# Patient Record
Sex: Male | Born: 1950
Health system: Southern US, Community
[De-identification: ages and names within clinical notes are randomized; demographics above are authoritative.]

## PROBLEM LIST (undated history)

## (undated) DIAGNOSIS — I219 Acute myocardial infarction, unspecified: Secondary | ICD-10-CM

## (undated) DIAGNOSIS — R0609 Other forms of dyspnea: Secondary | ICD-10-CM

## (undated) DIAGNOSIS — E119 Type 2 diabetes mellitus without complications: Secondary | ICD-10-CM

## (undated) DIAGNOSIS — F329 Major depressive disorder, single episode, unspecified: Secondary | ICD-10-CM

## (undated) DIAGNOSIS — E7800 Pure hypercholesterolemia, unspecified: Secondary | ICD-10-CM

## (undated) DIAGNOSIS — I10 Essential (primary) hypertension: Secondary | ICD-10-CM

## (undated) DIAGNOSIS — M1711 Unilateral primary osteoarthritis, right knee: Secondary | ICD-10-CM

## (undated) DIAGNOSIS — Z9861 Coronary angioplasty status: Secondary | ICD-10-CM

## (undated) DIAGNOSIS — Z87442 Personal history of urinary calculi: Secondary | ICD-10-CM

## (undated) DIAGNOSIS — I2510 Atherosclerotic heart disease of native coronary artery without angina pectoris: Secondary | ICD-10-CM

## (undated) DIAGNOSIS — W3400XA Accidental discharge from unspecified firearms or gun, initial encounter: Secondary | ICD-10-CM

## (undated) DIAGNOSIS — F419 Anxiety disorder, unspecified: Secondary | ICD-10-CM

## (undated) DIAGNOSIS — M1712 Unilateral primary osteoarthritis, left knee: Secondary | ICD-10-CM

## (undated) DIAGNOSIS — F32A Depression, unspecified: Secondary | ICD-10-CM

## (undated) DIAGNOSIS — E669 Obesity, unspecified: Secondary | ICD-10-CM

## (undated) HISTORY — PX: LITHOTRIPSY: SUR834

## (undated) HISTORY — PX: FRACTURE SURGERY: SHX138

## (undated) HISTORY — DX: Obesity, unspecified: E66.9

## (undated) HISTORY — PX: CYSTOSCOPY W/ STONE MANIPULATION: SHX1427

## (undated) HISTORY — DX: Unilateral primary osteoarthritis, left knee: M17.12

## (undated) HISTORY — DX: Other forms of dyspnea: R06.09

## (undated) HISTORY — DX: Unilateral primary osteoarthritis, right knee: M17.11

## (undated) HISTORY — PX: CORONARY ANGIOPLASTY WITH STENT PLACEMENT: SHX49

## (undated) HISTORY — PX: JOINT REPLACEMENT: SHX530

## (undated) HISTORY — DX: Morbid (severe) obesity due to excess calories: E66.01

## (undated) HISTORY — DX: Type 2 diabetes mellitus without complications: E11.9

## (undated) HISTORY — DX: Coronary angioplasty status: Z98.61

---
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
## 1984-05-14 DIAGNOSIS — W3400XA Accidental discharge from unspecified firearms or gun, initial encounter: Secondary | ICD-10-CM

## 1984-05-14 HISTORY — DX: Accidental discharge from unspecified firearms or gun, initial encounter: W34.00XA

## 1988-05-14 DIAGNOSIS — I219 Acute myocardial infarction, unspecified: Secondary | ICD-10-CM

## 1988-05-14 HISTORY — DX: Acute myocardial infarction, unspecified: I21.9

## 2000-05-27 ENCOUNTER — Encounter: Admission: RE | Admit: 2000-05-27 | Discharge: 2000-08-25 | Payer: Self-pay | Admitting: Endocrinology

## 2006-11-06 ENCOUNTER — Encounter (INDEPENDENT_AMBULATORY_CARE_PROVIDER_SITE_OTHER): Payer: Self-pay | Admitting: *Deleted

## 2006-11-06 ENCOUNTER — Ambulatory Visit (HOSPITAL_COMMUNITY): Admission: RE | Admit: 2006-11-06 | Discharge: 2006-11-06 | Payer: Self-pay | Admitting: *Deleted

## 2010-09-26 NOTE — Op Note (Signed)
Corey Romero, Corey Romero NO.:  [id number]   MEDICAL RECORD NO.:  [id number]          PATIENT TYPE:  AMB   LOCATION:  ENDO                         FACILITY:  [facility]   PHYSICIAN:  Georgiana Spinner, M.D.    DATE OF BIRTH:  01-17-1951   DATE OF PROCEDURE:  11/06/2006  DATE OF DISCHARGE:                               OPERATIVE REPORT   PROCEDURE:  Colonoscopy.   INDICATIONS:  Colon polyp, colon cancer screening.   ANESTHESIA:  Demerol 120 mg, Versed 10 mg, Benadryl 12.5 mg.   PROCEDURE:  With the patient mildly sedated in the left lateral  decubitus position, a rectal examination was attempted.  Subsequently  the Pentax videoscopic colonoscope was inserted into the rectum and  passed under direct vision to the cecum; identified by ileocecal valve  and appendiceal orifice, both of which were photographed.  From this  point the colonoscope was slowly withdrawn taking circumferential views  of colonic mucosa stopping at various places to wash and suction  retained fecal material so that the prep was slightly suboptimal in that  regards.   We did this until we withdrew all the way to the rectum which showed a  small polyp that was photographed and removed using snare cautery  technique setting of 20/150 blended current.  The tissue was suctioned  into the endoscope, and retrieved through a tissue trap for pathology.  The endoscope was then placed in retroflexion to view the anal canal  from above.  The endoscope was straightened and withdrawn.  The  patient's vital signs, and pulse oximeter remained stable.  The patient  tolerated procedure well without apparent complications.   FINDINGS:  1. Internal hemorrhoids that were bleeding somewhat possibly due to      the trauma of prep and/or the procedure.  2. Diverticulosis, moderately severe of the of sigmoid colon.  3. Small polyp of rectum, removed.  Await biopsy report.  4. The patient will call me for results and  follow-up with me as      needed as an outpatient.           ______________________________  Georgiana Spinner, M.D.     GMO/MEDQ  D:  11/06/2006  T:  11/06/2006  Job:  [id number]

## 2012-06-24 ENCOUNTER — Other Ambulatory Visit: Payer: Self-pay | Admitting: Orthopedic Surgery

## 2012-06-24 ENCOUNTER — Encounter (HOSPITAL_COMMUNITY): Payer: Self-pay

## 2012-06-25 ENCOUNTER — Encounter (HOSPITAL_COMMUNITY): Payer: Self-pay

## 2012-06-30 ENCOUNTER — Encounter (HOSPITAL_COMMUNITY)
Admission: RE | Admit: 2012-06-30 | Discharge: 2012-06-30 | Disposition: A | Discharge status: Home / Self Care | Payer: BC Managed Care – PPO | Source: Ambulatory Visit | Attending: Orthopedic Surgery | Admitting: Orthopedic Surgery

## 2012-06-30 ENCOUNTER — Encounter (HOSPITAL_COMMUNITY): Payer: Self-pay

## 2012-06-30 DIAGNOSIS — Z01812 Encounter for preprocedural laboratory examination: Secondary | ICD-10-CM | POA: Insufficient documentation

## 2012-06-30 DIAGNOSIS — Z01811 Encounter for preprocedural respiratory examination: Secondary | ICD-10-CM | POA: Insufficient documentation

## 2012-06-30 DIAGNOSIS — Z01810 Encounter for preprocedural cardiovascular examination: Secondary | ICD-10-CM | POA: Insufficient documentation

## 2012-06-30 DIAGNOSIS — Z01818 Encounter for other preprocedural examination: Secondary | ICD-10-CM | POA: Insufficient documentation

## 2012-06-30 HISTORY — DX: Essential (primary) hypertension: I10

## 2012-06-30 HISTORY — DX: Anxiety disorder, unspecified: F41.9

## 2012-06-30 HISTORY — DX: Depression, unspecified: F32.A

## 2012-06-30 HISTORY — DX: Atherosclerotic heart disease of native coronary artery without angina pectoris: I25.10

## 2012-06-30 HISTORY — DX: Accidental discharge from unspecified firearms or gun, initial encounter: W34.00XA

## 2012-06-30 HISTORY — DX: Major depressive disorder, single episode, unspecified: F32.9

## 2012-06-30 LAB — CBC WITH DIFFERENTIAL/PLATELET
Basophils Relative: 0 % (ref 0–1)
Eosinophils Absolute: 0.3 10*3/uL (ref 0.0–0.7)
Eosinophils Relative: 4 % (ref 0–5)
HCT: 43.4 % (ref 39.0–52.0)
Hemoglobin: 14.8 g/dL (ref 13.0–17.0)
Lymphs Abs: 3.1 10*3/uL (ref 0.7–4.0)
MCH: 32.2 pg (ref 26.0–34.0)
MCHC: 34.1 g/dL (ref 30.0–36.0)
MCV: 94.6 fL (ref 78.0–100.0)
Monocytes Absolute: 1.1 10*3/uL — ABNORMAL HIGH (ref 0.1–1.0)
Monocytes Relative: 11 % (ref 3–12)
RBC: 4.59 MIL/uL (ref 4.22–5.81)

## 2012-06-30 LAB — BASIC METABOLIC PANEL
BUN: 22 mg/dL (ref 6–23)
CO2: 28 mEq/L (ref 19–32)
GFR calc non Af Amer: 68 mL/min — ABNORMAL LOW (ref >90)
Glucose, Bld: 178 mg/dL — ABNORMAL HIGH (ref 70–99)
Potassium: 5.5 mEq/L — ABNORMAL HIGH (ref 3.5–5.1)
Sodium: 143 mEq/L (ref 135–145)

## 2012-06-30 LAB — URINALYSIS, ROUTINE W REFLEX MICROSCOPIC
Glucose, UA: >1000 mg/dL — AB
Ketones, ur: 15 mg/dL — AB
Leukocytes, UA: NEGATIVE
Nitrite: NEGATIVE
pH: 5 (ref 5.0–8.0)

## 2012-06-30 LAB — TYPE AND SCREEN

## 2012-06-30 LAB — URINE MICROSCOPIC-ADD ON

## 2012-06-30 LAB — SURGICAL PCR SCREEN: MRSA, PCR: NEGATIVE

## 2012-06-30 NOTE — Progress Notes (Signed)
Patient questionnaire for sleep apnea faxed to Dr Juleen China.

## 2012-06-30 NOTE — Pre-Procedure Instructions (Signed)
AIMAN NOE  06/30/2012   Your procedure is scheduled on:  Wednesday, Feb. 26  Report to [facility] at 1030 AM.  Call this number if you have problems the morning of surgery: [phone]   Remember:   Do not eat food or drink liquids after midnight.Tuesday night   Take these medicines the morning of surgery with A SIP OF WATER: Xanax,Metoprolol,Oxycodone,   Do not wear jewelry, make-up or nail polish.  Do not wear lotions, powders, or perfumes. You may wear deodorant.  Do not shave 48 hours prior to surgery. Men may shave face and neck.  Do not bring valuables to the hospital.  Contacts, dentures or bridgework may not be worn into surgery.  Leave suitcase in the car. After surgery it may be brought to your room.  For patients admitted to the hospital, checkout time is 11:00 AM the day of  discharge.   Special Instructions: Incentive Spirometry - Practice and bring it with you on the day of surgery. Shower using CHG 2 nights before surgery and the night before surgery.  If you shower the day of surgery use CHG.  Use special wash - you have one bottle of CHG for all showers.  You should use approximately 1/3 of the bottle for each shower.   Please read over the following fact sheets that you were given: Pain Booklet, Coughing and Deep Breathing, Blood Transfusion Information, Total Joint Packet and Surgical Site Infection Prevention

## 2012-06-30 NOTE — Progress Notes (Signed)
Stop Corey Romero tool for  Sleep apnea faxed to Dr Juleen China EKG requested from Dr Juleen China to compare with abnl EKG done today pre op
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
## 2012-07-04 NOTE — Progress Notes (Signed)
Spoke with Agustin Cree at Dr. Wadie Lessen office today.  She states patient saw Dr. Harm[facility] yesterday, 07/03/12.  She is waiting for note from Dr. Anne Fu.  She states note should show up in EPIC.  Not in chart yet.

## 2012-07-08 MED ORDER — CEFAZOLIN SODIUM-DEXTROSE 2-3 GM-% IV SOLR
2.0000 g | INTRAVENOUS | Status: AC
  Administered 2012-07-09: 2 g via INTRAVENOUS
  Filled 2012-07-08: qty 50

## 2012-07-08 NOTE — Progress Notes (Signed)
REQUESTED CARDIAC TESTS,  AND CARDIAC CLEARANCE NOTE FROM [facility]. (PATIENT WAS SEEN TODAY 07/08/12).
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
## 2012-07-09 ENCOUNTER — Encounter (HOSPITAL_COMMUNITY): Payer: Self-pay | Admitting: *Deleted

## 2012-07-09 ENCOUNTER — Inpatient Hospital Stay (HOSPITAL_COMMUNITY)
Admission: RE | Admit: 2012-07-09 | Discharge: 2012-07-12 | DRG: 209 | Disposition: A | Discharge status: Home Health | Payer: BC Managed Care – PPO | Source: Ambulatory Visit | Attending: Orthopedic Surgery | Admitting: Orthopedic Surgery

## 2012-07-09 ENCOUNTER — Inpatient Hospital Stay (HOSPITAL_COMMUNITY): Payer: BC Managed Care – PPO | Admitting: Anesthesiology

## 2012-07-09 ENCOUNTER — Encounter (HOSPITAL_COMMUNITY): Payer: Self-pay | Admitting: Vascular Surgery

## 2012-07-09 ENCOUNTER — Encounter (HOSPITAL_COMMUNITY)
Admission: RE | Disposition: A | Discharge status: Home Health | Payer: Self-pay | Source: Ambulatory Visit | Attending: Orthopedic Surgery

## 2012-07-09 DIAGNOSIS — Z7982 Long term (current) use of aspirin: Secondary | ICD-10-CM

## 2012-07-09 DIAGNOSIS — M1712 Unilateral primary osteoarthritis, left knee: Secondary | ICD-10-CM

## 2012-07-09 DIAGNOSIS — F411 Generalized anxiety disorder: Secondary | ICD-10-CM | POA: Diagnosis present

## 2012-07-09 DIAGNOSIS — Z794 Long term (current) use of insulin: Secondary | ICD-10-CM

## 2012-07-09 DIAGNOSIS — Z01818 Encounter for other preprocedural examination: Secondary | ICD-10-CM

## 2012-07-09 DIAGNOSIS — I2510 Atherosclerotic heart disease of native coronary artery without angina pectoris: Secondary | ICD-10-CM | POA: Diagnosis present

## 2012-07-09 DIAGNOSIS — Z9861 Coronary angioplasty status: Secondary | ICD-10-CM

## 2012-07-09 DIAGNOSIS — Z79899 Other long term (current) drug therapy: Secondary | ICD-10-CM

## 2012-07-09 DIAGNOSIS — Z01810 Encounter for preprocedural cardiovascular examination: Secondary | ICD-10-CM

## 2012-07-09 DIAGNOSIS — Z01811 Encounter for preprocedural respiratory examination: Secondary | ICD-10-CM

## 2012-07-09 DIAGNOSIS — Z01812 Encounter for preprocedural laboratory examination: Secondary | ICD-10-CM

## 2012-07-09 DIAGNOSIS — M1710 Unilateral primary osteoarthritis, unspecified knee: Principal | ICD-10-CM | POA: Diagnosis present

## 2012-07-09 DIAGNOSIS — F3289 Other specified depressive episodes: Secondary | ICD-10-CM | POA: Diagnosis present

## 2012-07-09 DIAGNOSIS — E119 Type 2 diabetes mellitus without complications: Secondary | ICD-10-CM | POA: Diagnosis present

## 2012-07-09 DIAGNOSIS — I10 Essential (primary) hypertension: Secondary | ICD-10-CM | POA: Diagnosis present

## 2012-07-09 DIAGNOSIS — Z87891 Personal history of nicotine dependence: Secondary | ICD-10-CM

## 2012-07-09 DIAGNOSIS — F329 Major depressive disorder, single episode, unspecified: Secondary | ICD-10-CM | POA: Diagnosis present

## 2012-07-09 HISTORY — PX: TOTAL KNEE ARTHROPLASTY: SHX125

## 2012-07-09 HISTORY — DX: Unilateral primary osteoarthritis, left knee: M17.12

## 2012-07-09 LAB — GLUCOSE, CAPILLARY
Glucose-Capillary: 293 mg/dL — ABNORMAL HIGH (ref 70–99)
Glucose-Capillary: 229 mg/dL — ABNORMAL HIGH (ref 70–99)
Glucose-Capillary: 212 mg/dL — ABNORMAL HIGH (ref 70–99)
Glucose-Capillary: 235 mg/dL — ABNORMAL HIGH (ref 70–99)

## 2012-07-09 SURGERY — ARTHROPLASTY, KNEE, TOTAL
Anesthesia: General | Site: Knee | Laterality: Left | Wound class: Clean

## 2012-07-09 MED ORDER — OXYCODONE HCL 5 MG PO TABS
10.0000 mg | ORAL_TABLET | ORAL | Status: DC | PRN
  Administered 2012-07-09: 10 mg via ORAL
  Administered 2012-07-10 (×3): 15 mg via ORAL
  Administered 2012-07-10 (×2): 10 mg via ORAL
  Administered 2012-07-11 (×4): 15 mg via ORAL
  Administered 2012-07-11: 10 mg via ORAL
  Administered 2012-07-12 (×2): 15 mg via ORAL
  Filled 2012-07-09: qty 3
  Filled 2012-07-09: qty 2
  Filled 2012-07-09 (×5): qty 3
  Filled 2012-07-09 (×2): qty 2
  Filled 2012-07-09: qty 3
  Filled 2012-07-09: qty 2
  Filled 2012-07-09 (×3): qty 3

## 2012-07-09 MED ORDER — ONDANSETRON HCL 4 MG/2ML IJ SOLN: 4.0000 mg | Freq: Four times a day (QID) | INTRAMUSCULAR | Status: DC | PRN

## 2012-07-09 MED ORDER — ONDANSETRON HCL 4 MG/2ML IJ SOLN
INTRAMUSCULAR | Status: DC | PRN
  Administered 2012-07-09: 4 mg via INTRAVENOUS

## 2012-07-09 MED ORDER — PHENOL 1.4 % MT LIQD: 1.0000 | OROMUCOSAL | Status: DC | PRN

## 2012-07-09 MED ORDER — ALUM & MAG HYDROXIDE-SIMETH 200-200-20 MG/5ML PO SUSP: 30.0000 mL | ORAL | Status: DC | PRN

## 2012-07-09 MED ORDER — PIOGLITAZONE HCL 45 MG PO TABS
45.0000 mg | ORAL_TABLET | Freq: Every day | ORAL | Status: DC
  Administered 2012-07-09 – 2012-07-12 (×4): 45 mg via ORAL
  Filled 2012-07-09 (×4): qty 1

## 2012-07-09 MED ORDER — SODIUM CHLORIDE 0.9 % IR SOLN
Status: DC | PRN
  Administered 2012-07-09: 3000 mL

## 2012-07-09 MED ORDER — FENTANYL CITRATE 0.05 MG/ML IJ SOLN
INTRAMUSCULAR | Status: DC | PRN
  Administered 2012-07-09: 50 ug via INTRAVENOUS
  Administered 2012-07-09 (×2): 100 ug via INTRAVENOUS

## 2012-07-09 MED ORDER — SERTRALINE HCL 100 MG PO TABS
100.0000 mg | ORAL_TABLET | Freq: Every day | ORAL | Status: DC
  Administered 2012-07-09 – 2012-07-12 (×4): 100 mg via ORAL
  Filled 2012-07-09 (×4): qty 1

## 2012-07-09 MED ORDER — METHOCARBAMOL 500 MG PO TABS
500.0000 mg | ORAL_TABLET | Freq: Four times a day (QID) | ORAL | Status: DC | PRN
  Administered 2012-07-09 – 2012-07-12 (×8): 500 mg via ORAL
  Filled 2012-07-09 (×8): qty 1

## 2012-07-09 MED ORDER — METOPROLOL TARTRATE 25 MG PO TABS: 25.0000 mg | ORAL_TABLET | Freq: Once | ORAL | Status: AC

## 2012-07-09 MED ORDER — PROMETHAZINE HCL 25 MG/ML IJ SOLN: 6.2500 mg | INTRAMUSCULAR | Status: DC | PRN

## 2012-07-09 MED ORDER — MENTHOL 3 MG MT LOZG: 1.0000 | LOZENGE | OROMUCOSAL | Status: DC | PRN

## 2012-07-09 MED ORDER — DEXTROSE-NACL 5-0.45 % IV SOLN: INTRAVENOUS | Status: DC

## 2012-07-09 MED ORDER — ALPRAZOLAM 0.5 MG PO TABS: 2.0000 mg | ORAL_TABLET | Freq: Three times a day (TID) | ORAL | Status: DC | PRN

## 2012-07-09 MED ORDER — CEFUROXIME SODIUM 1.5 G IJ SOLR
INTRAMUSCULAR | Status: AC
  Filled 2012-07-09: qty 1.5

## 2012-07-09 MED ORDER — HYDROMORPHONE HCL PF 1 MG/ML IJ SOLN
0.2500 mg | INTRAMUSCULAR | Status: DC | PRN
  Administered 2012-07-09 (×2): 0.5 mg via INTRAVENOUS

## 2012-07-09 MED ORDER — FLEET ENEMA 7-19 GM/118ML RE ENEM: 1.0000 | ENEMA | Freq: Once | RECTAL | Status: AC | PRN

## 2012-07-09 MED ORDER — LISINOPRIL 20 MG PO TABS
20.0000 mg | ORAL_TABLET | Freq: Every day | ORAL | Status: DC
  Administered 2012-07-09 – 2012-07-12 (×4): 20 mg via ORAL
  Filled 2012-07-09 (×4): qty 1

## 2012-07-09 MED ORDER — ACETAMINOPHEN 650 MG RE SUPP: 650.0000 mg | Freq: Four times a day (QID) | RECTAL | Status: DC | PRN

## 2012-07-09 MED ORDER — KCL IN DEXTROSE-NACL 20-5-0.45 MEQ/L-%-% IV SOLN
INTRAVENOUS | Status: DC
  Administered 2012-07-09 – 2012-07-10 (×2): via INTRAVENOUS
  Filled 2012-07-09 (×10): qty 1000

## 2012-07-09 MED ORDER — HYDROMORPHONE HCL PF 1 MG/ML IJ SOLN
INTRAMUSCULAR | Status: AC
  Administered 2012-07-09: 0.5 mg via INTRAVENOUS
  Filled 2012-07-09: qty 1

## 2012-07-09 MED ORDER — METOCLOPRAMIDE HCL 5 MG/ML IJ SOLN: 5.0000 mg | Freq: Three times a day (TID) | INTRAMUSCULAR | Status: DC | PRN

## 2012-07-09 MED ORDER — LIDOCAINE HCL (CARDIAC) 20 MG/ML IV SOLN
INTRAVENOUS | Status: DC | PRN
  Administered 2012-07-09: 100 mg via INTRAVENOUS

## 2012-07-09 MED ORDER — OXYCODONE HCL 5 MG/5ML PO SOLN: 5.0000 mg | Freq: Once | ORAL | Status: DC | PRN

## 2012-07-09 MED ORDER — METHOCARBAMOL 500 MG PO TABS
ORAL_TABLET | ORAL | Status: AC
  Administered 2012-07-09: 500 mg via ORAL
  Filled 2012-07-09: qty 1

## 2012-07-09 MED ORDER — HYDROMORPHONE HCL PF 1 MG/ML IJ SOLN: 1.0000 mg | INTRAMUSCULAR | Status: DC | PRN

## 2012-07-09 MED ORDER — ZOLPIDEM TARTRATE 5 MG PO TABS: 5.0000 mg | ORAL_TABLET | Freq: Every evening | ORAL | Status: DC | PRN

## 2012-07-09 MED ORDER — DIPHENHYDRAMINE HCL 12.5 MG/5ML PO ELIX: 12.5000 mg | ORAL_SOLUTION | ORAL | Status: DC | PRN

## 2012-07-09 MED ORDER — OXYCODONE HCL 5 MG PO TABS: 5.0000 mg | ORAL_TABLET | Freq: Once | ORAL | Status: DC | PRN

## 2012-07-09 MED ORDER — 0.9 % SODIUM CHLORIDE (POUR BTL) OPTIME
TOPICAL | Status: DC | PRN
  Administered 2012-07-09: 1000 mL

## 2012-07-09 MED ORDER — GLIPIZIDE 10 MG PO TABS
10.0000 mg | ORAL_TABLET | Freq: Every day | ORAL | Status: DC
  Administered 2012-07-10 – 2012-07-12 (×3): 10 mg via ORAL
  Filled 2012-07-09 (×4): qty 1

## 2012-07-09 MED ORDER — INSULIN DETEMIR 100 UNIT/ML ~~LOC~~ SOLN
40.0000 [IU] | Freq: Every evening | SUBCUTANEOUS | Status: DC
  Administered 2012-07-09 – 2012-07-11 (×3): 40 [IU] via SUBCUTANEOUS
  Filled 2012-07-09: qty 10

## 2012-07-09 MED ORDER — CEFUROXIME SODIUM 1.5 G IJ SOLR
INTRAMUSCULAR | Status: DC | PRN
  Administered 2012-07-09: 1.5 g

## 2012-07-09 MED ORDER — MAGNESIUM HYDROXIDE 400 MG/5ML PO SUSP: 30.0000 mL | Freq: Every day | ORAL | Status: DC | PRN

## 2012-07-09 MED ORDER — DEXTROSE 5 % IV SOLN: 500.0000 mg | Freq: Four times a day (QID) | INTRAVENOUS | Status: DC | PRN

## 2012-07-09 MED ORDER — CHLORHEXIDINE GLUCONATE 4 % EX LIQD: 60.0000 mL | Freq: Once | CUTANEOUS | Status: DC

## 2012-07-09 MED ORDER — FENTANYL CITRATE 0.05 MG/ML IJ SOLN
INTRAMUSCULAR | Status: AC
  Filled 2012-07-09: qty 2

## 2012-07-09 MED ORDER — METOPROLOL TARTRATE 50 MG PO TABS
ORAL_TABLET | ORAL | Status: AC
  Filled 2012-07-09: qty 1

## 2012-07-09 MED ORDER — MIDAZOLAM HCL 2 MG/2ML IJ SOLN: 1.0000 mg | INTRAMUSCULAR | Status: DC | PRN

## 2012-07-09 MED ORDER — INSULIN ASPART 100 UNIT/ML ~~LOC~~ SOLN
0.0000 [IU] | Freq: Three times a day (TID) | SUBCUTANEOUS | Status: DC
  Administered 2012-07-09 – 2012-07-11 (×6): 5 [IU] via SUBCUTANEOUS
  Administered 2012-07-11: 2 [IU] via SUBCUTANEOUS
  Administered 2012-07-12: 3 [IU] via SUBCUTANEOUS

## 2012-07-09 MED ORDER — ATORVASTATIN CALCIUM 20 MG PO TABS
20.0000 mg | ORAL_TABLET | Freq: Every evening | ORAL | Status: DC
  Administered 2012-07-09 – 2012-07-11 (×3): 20 mg via ORAL
  Filled 2012-07-09 (×4): qty 1

## 2012-07-09 MED ORDER — BISACODYL 5 MG PO TBEC: 5.0000 mg | DELAYED_RELEASE_TABLET | Freq: Every day | ORAL | Status: DC | PRN

## 2012-07-09 MED ORDER — PROPOFOL 10 MG/ML IV BOLUS
INTRAVENOUS | Status: DC | PRN
  Administered 2012-07-09: 200 mg via INTRAVENOUS
  Administered 2012-07-09: 100 mg via INTRAVENOUS

## 2012-07-09 MED ORDER — MIDAZOLAM HCL 5 MG/5ML IJ SOLN
INTRAMUSCULAR | Status: DC | PRN
  Administered 2012-07-09: 2 mg via INTRAVENOUS

## 2012-07-09 MED ORDER — METFORMIN HCL 500 MG PO TABS
1000.0000 mg | ORAL_TABLET | Freq: Two times a day (BID) | ORAL | Status: DC
  Administered 2012-07-09 – 2012-07-12 (×6): 1000 mg via ORAL
  Filled 2012-07-09 (×8): qty 2

## 2012-07-09 MED ORDER — ACETAMINOPHEN 325 MG PO TABS: 650.0000 mg | ORAL_TABLET | Freq: Four times a day (QID) | ORAL | Status: DC | PRN

## 2012-07-09 MED ORDER — EXENATIDE ER 2 MG ~~LOC~~ SUSR: 2.0000 mg | SUBCUTANEOUS | Status: DC

## 2012-07-09 MED ORDER — METOPROLOL TARTRATE 12.5 MG HALF TABLET
ORAL_TABLET | ORAL | Status: AC
  Administered 2012-07-09: 25 mg via ORAL
  Filled 2012-07-09: qty 2

## 2012-07-09 MED ORDER — ASPIRIN EC 325 MG PO TBEC
325.0000 mg | DELAYED_RELEASE_TABLET | Freq: Two times a day (BID) | ORAL | Status: DC
  Administered 2012-07-09 – 2012-07-12 (×6): 325 mg via ORAL
  Filled 2012-07-09 (×7): qty 1

## 2012-07-09 MED ORDER — ONDANSETRON HCL 4 MG PO TABS: 4.0000 mg | ORAL_TABLET | Freq: Four times a day (QID) | ORAL | Status: DC | PRN

## 2012-07-09 MED ORDER — FENTANYL CITRATE 0.05 MG/ML IJ SOLN: 50.0000 ug | Freq: Once | INTRAMUSCULAR | Status: DC

## 2012-07-09 MED ORDER — CELECOXIB 200 MG PO CAPS
200.0000 mg | ORAL_CAPSULE | Freq: Two times a day (BID) | ORAL | Status: DC | PRN
  Filled 2012-07-09: qty 1

## 2012-07-09 MED ORDER — METOPROLOL TARTRATE 25 MG PO TABS
25.0000 mg | ORAL_TABLET | Freq: Every day | ORAL | Status: DC
  Administered 2012-07-10 – 2012-07-12 (×3): 25 mg via ORAL
  Filled 2012-07-09 (×4): qty 1

## 2012-07-09 MED ORDER — METOCLOPRAMIDE HCL 10 MG PO TABS: 5.0000 mg | ORAL_TABLET | Freq: Three times a day (TID) | ORAL | Status: DC | PRN

## 2012-07-09 MED ORDER — MIDAZOLAM HCL 2 MG/2ML IJ SOLN
INTRAMUSCULAR | Status: AC
  Filled 2012-07-09: qty 2

## 2012-07-09 MED ORDER — SUCCINYLCHOLINE CHLORIDE 20 MG/ML IJ SOLN
INTRAMUSCULAR | Status: DC | PRN
  Administered 2012-07-09: 100 mg via INTRAVENOUS

## 2012-07-09 MED ORDER — LACTATED RINGERS IV SOLN
INTRAVENOUS | Status: DC
  Administered 2012-07-09 (×2): via INTRAVENOUS

## 2012-07-09 SURGICAL SUPPLY — 57 items
BANDAGE ESMARK 6X9 LF (GAUZE/BANDAGES/DRESSINGS) ×1 IMPLANT
BLADE SAG 18X100X1.27 (BLADE) ×2 IMPLANT
BLADE SAW SGTL 13X75X1.27 (BLADE) ×2 IMPLANT
BLADE SURG ROTATE 9660 (MISCELLANEOUS) IMPLANT
BNDG CMPR 9X6 STRL LF SNTH (GAUZE/BANDAGES/DRESSINGS) ×1
BNDG CMPR MED 10X6 ELC LF (GAUZE/BANDAGES/DRESSINGS) ×1
BNDG ELASTIC 6X10 VLCR STRL LF (GAUZE/BANDAGES/DRESSINGS) ×2 IMPLANT
BNDG ESMARK 6X9 LF (GAUZE/BANDAGES/DRESSINGS) ×2
BOWL SMART MIX CTS (DISPOSABLE) ×2 IMPLANT
CEMENT HV SMART SET (Cement) ×4 IMPLANT
CLOTH BEACON ORANGE TIMEOUT ST (SAFETY) ×2 IMPLANT
COVER SURGICAL LIGHT HANDLE (MISCELLANEOUS) ×2 IMPLANT
CUFF TOURNIQUET SINGLE 34IN LL (TOURNIQUET CUFF) ×2 IMPLANT
CUFF TOURNIQUET SINGLE 44IN (TOURNIQUET CUFF) IMPLANT
DRAPE EXTREMITY T 121X128X90 (DRAPE) ×2 IMPLANT
DRAPE U-SHAPE 47X51 STRL (DRAPES) ×2 IMPLANT
DRSG PAD ABDOMINAL 8X10 ST (GAUZE/BANDAGES/DRESSINGS) ×4 IMPLANT
DURAPREP 26ML APPLICATOR (WOUND CARE) ×2 IMPLANT
ELECT REM PT RETURN 9FT ADLT (ELECTROSURGICAL) ×2
ELECTRODE REM PT RTRN 9FT ADLT (ELECTROSURGICAL) ×1 IMPLANT
EVACUATOR 1/8 PVC DRAIN (DRAIN) ×2 IMPLANT
GAUZE XEROFORM 1X8 LF (GAUZE/BANDAGES/DRESSINGS) ×2 IMPLANT
GLOVE BIO SURGEON STRL SZ7 (GLOVE) ×2 IMPLANT
GLOVE BIO SURGEON STRL SZ7.5 (GLOVE) ×6 IMPLANT
GLOVE BIOGEL PI IND STRL 7.0 (GLOVE) ×1 IMPLANT
GLOVE BIOGEL PI IND STRL 7.5 (GLOVE) IMPLANT
GLOVE BIOGEL PI IND STRL 8 (GLOVE) ×1 IMPLANT
GLOVE BIOGEL PI INDICATOR 7.0 (GLOVE) ×2
GLOVE BIOGEL PI INDICATOR 7.5 (GLOVE) ×1
GLOVE BIOGEL PI INDICATOR 8 (GLOVE) ×1
GLOVE SURG SS PI 8.5 STRL IVOR (GLOVE) ×2
GLOVE SURG SS PI 8.5 STRL STRW (GLOVE) ×2 IMPLANT
GOWN PREVENTION PLUS XLARGE (GOWN DISPOSABLE) ×3 IMPLANT
GOWN STRL NON-REIN LRG LVL3 (GOWN DISPOSABLE) ×4 IMPLANT
HANDPIECE INTERPULSE COAX TIP (DISPOSABLE) ×2
HOOD PEEL AWAY FACE SHEILD DIS (HOOD) ×4 IMPLANT
KIT BASIN OR (CUSTOM PROCEDURE TRAY) ×2 IMPLANT
KIT ROOM TURNOVER OR (KITS) ×2 IMPLANT
MANIFOLD NEPTUNE II (INSTRUMENTS) ×2 IMPLANT
NS IRRIG 1000ML POUR BTL (IV SOLUTION) ×2 IMPLANT
PACK TOTAL JOINT (CUSTOM PROCEDURE TRAY) ×2 IMPLANT
PAD ARMBOARD 7.5X6 YLW CONV (MISCELLANEOUS) ×4 IMPLANT
PADDING CAST COTTON 6X4 STRL (CAST SUPPLIES) ×2 IMPLANT
SET HNDPC FAN SPRY TIP SCT (DISPOSABLE) ×1 IMPLANT
SPONGE GAUZE 4X4 12PLY (GAUZE/BANDAGES/DRESSINGS) ×4 IMPLANT
STAPLER VISISTAT 35W (STAPLE) ×2 IMPLANT
SUCTION FRAZIER TIP 10 FR DISP (SUCTIONS) ×2 IMPLANT
SUT VIC AB 0 CTX 36 (SUTURE) ×2
SUT VIC AB 0 CTX36XBRD ANTBCTR (SUTURE) ×1 IMPLANT
SUT VIC AB 1 CTX 36 (SUTURE) ×2
SUT VIC AB 1 CTX36XBRD ANBCTR (SUTURE) ×1 IMPLANT
SUT VIC AB 2-0 CT1 27 (SUTURE) ×2
SUT VIC AB 2-0 CT1 TAPERPNT 27 (SUTURE) ×1 IMPLANT
TOWEL OR 17X24 6PK STRL BLUE (TOWEL DISPOSABLE) ×2 IMPLANT
TOWEL OR 17X26 10 PK STRL BLUE (TOWEL DISPOSABLE) ×2 IMPLANT
TRAY FOLEY CATH 14FR (SET/KITS/TRAYS/PACK) ×2 IMPLANT
WATER STERILE IRR 1000ML POUR (IV SOLUTION) ×8 IMPLANT

## 2012-07-09 NOTE — Preoperative (Signed)
Beta Blockers   Reason not to administer Beta Blockers:took lopressor today 

## 2012-07-09 NOTE — Interval H&P Note (Signed)
History and Physical Interval Note:  07/09/2012 12:10 PM  Corey Romero  has presented today for surgery, with the diagnosis of LEFT KNEE OSTEOARTHRITIS   The various methods of treatment have been discussed with the patient and family. After consideration of risks, benefits and other options for treatment, the patient has consented to  Procedure(s): TOTAL KNEE ARTHROPLASTY (Left) as a surgical intervention .  The patient's history has been reviewed, patient examined, no change in status, stable for surgery.  I have reviewed the patient's chart and labs.  Questions were answered to the patient's satisfaction.     Nestor Lewandowsky

## 2012-07-09 NOTE — Anesthesia Procedure Notes (Addendum)
Procedure Name: Intubation Date/Time: 07/09/2012 1:50 PM Performed by: Charm Barges, DAVID R Pre-anesthesia Checklist: Patient identified, Emergency Drugs available, Suction available, Patient being monitored and Timeout performed Patient Re-evaluated:Patient Re-evaluated prior to inductionOxygen Delivery Method: Circle system utilized Preoxygenation: Pre-oxygenation with 100% oxygen Intubation Type: Rapid sequence and IV induction Laryngoscope Size: Miller and 3 Grade View: Grade II Tube type: Oral Tube size: 8.0 mm Number of attempts: 1 Airway Equipment and Method: Stylet Placement Confirmation: ETT inserted through vocal cords under direct vision,  positive ETCO2 and breath sounds checked- equal and bilateral Secured at: 22 cm Tube secured with: Tape Dental Injury: Teeth and Oropharynx as per pre-operative assessment    Anesthesia Regional Block:  Femoral nerve block  Pre-Anesthetic Checklist: ,, timeout performed, Correct Patient, Correct Site, Correct Laterality, Correct Procedure, Correct Position, site marked, Risks and benefits discussed,  Surgical consent,  Pre-op evaluation,  At surgeon's request and post-op pain management  Laterality: Left  Prep: chloraprep       Needles:   Needle Type: Echogenic Needle          Additional Needles:  Procedures: Doppler guided Femoral nerve block  Nerve Stimulator or Paresthesia:  Response: 0.5 mA,   Additional Responses:   Narrative:  Start time: 07/09/2012 12:30 PM End time: 07/09/2012 12:45 PM Injection made incrementally with aspirations every 5 mL.  Performed by: Personally  Anesthesiologist: Dr. Randa Evens  Femoral nerve block

## 2012-07-09 NOTE — Transfer of Care (Signed)
Immediate Anesthesia Transfer of Care Note  Patient: Corey Romero  Procedure(s) Performed: Procedure(s): TOTAL KNEE ARTHROPLASTY (Left)  Patient Location: PACU  Anesthesia Type:GA combined with regional for post-op pain  Level of Consciousness: awake  Airway & Oxygen Therapy: Patient Spontanous Breathing and Patient connected to nasal cannula oxygen  Post-op Assessment: Report given to PACU RN, Post -op Vital signs reviewed and stable and Patient moving all extremities  Post vital signs: Reviewed and stable  Complications: No apparent anesthesia complications

## 2012-07-09 NOTE — Progress Notes (Signed)
Orthopedic Tech Progress Note Patient Details:  Corey Romero 07-25-1950 [id number]  CPM Left Knee CPM Left Knee: On Left Knee Flexion (Degrees): 60 Left Knee Extension (Degrees): 0 Additional Comments: applied overhead frame to bed.   Jennye Moccasin 07/09/2012, 3:36 PM

## 2012-07-09 NOTE — Anesthesia Preprocedure Evaluation (Addendum)
Anesthesia Evaluation  Patient identified by MRN, date of birth, ID band Patient awake    Reviewed: Allergy & Precautions, H&P , NPO status , Patient's Chart, lab work & pertinent test results  Airway Mallampati: II TM Distance: >3 FB Neck ROM: Full    Dental   Pulmonary neg pulmonary ROS,  breath sounds clear to auscultation        Cardiovascular hypertension, + CAD Rhythm:Regular Rate:Normal     Neuro/Psych Anxiety Depression    GI/Hepatic negative GI ROS, Neg liver ROS,   Endo/Other  diabetesMorbid obesity  Renal/GU      Musculoskeletal   Abdominal (+) + obese,   Peds  Hematology   Anesthesia Other Findings   Reproductive/Obstetrics                          Anesthesia Physical Anesthesia Plan  ASA: III  Anesthesia Plan: General   Post-op Pain Management:    Induction: Intravenous  Airway Management Planned: Oral ETT  Additional Equipment:   Intra-op Plan:   Post-operative Plan: Extubation in OR  Informed Consent: I have reviewed the patients History and Physical, chart, labs and discussed the procedure including the risks, benefits and alternatives for the proposed anesthesia with the patient or authorized representative who has indicated his/her understanding and acceptance.     Plan Discussed with: CRNA and Surgeon  Anesthesia Plan Comments:         Anesthesia Quick Evaluation

## 2012-07-09 NOTE — Plan of Care (Signed)
Problem: Consults Goal: Diagnosis- Total Joint Replacement Primary Total Knee     

## 2012-07-09 NOTE — Op Note (Signed)
PATIENT ID:      Corey Romero  MRN:     [id number] DOB/AGE:    1951/01/13 / 62 y.o.       OPERATIVE REPORT    DATE OF PROCEDURE:  07/09/2012       PREOPERATIVE DIAGNOSIS:   LEFT KNEE OSTEOARTHRITIS       There is no weight on file to calculate BMI.                                                        POSTOPERATIVE DIAGNOSIS:   LEFT KNEE OSTEOARTHRITIS                                                                       PROCEDURE:  Procedure(s): TOTAL KNEE ARTHROPLASTY Using Depuy Sigma RP implants #5L Femur, #5Tibia, 10mm sigma RP bearing, 41 Patella     SURGEON: Tiondra Fang J    ASSISTANT:   Shirl Harris PA-C   (Present and scrubbed throughout the case, critical for assistance with exposure, retraction, instrumentation, and closure.)         ANESTHESIA: GET with Femoral Nerve Block  DRAINS: foley, 2 medium hemovac in knee   TOURNIQUET TIME:   COMPLICATIONS:  None     SPECIMENS: None   INDICATIONS FOR PROCEDURE: The patient has  LEFT KNEE OSTEOARTHRITIS , varus deformities, XR shows bone on bone arthritis. Patient has failed all conservative measures including anti-inflammatory medicines, narcotics, attempts at  exercise and weight loss, cortisone injections and viscosupplementation.  Risks and benefits of surgery have been discussed, questions answered.   DESCRIPTION OF PROCEDURE: The patient identified by armband, received  right femoral nerve block and IV antibiotics, in the holding area at [facility]. Patient taken to the operating room, appropriate anesthetic  monitors were attached General endotracheal anesthesia induced with  the patient in supine position, Foley catheter was inserted. Tourniquet  applied high to the operative thigh. Lateral post and foot positioner  applied to the table, the lower extremity was then prepped and draped  in usual sterile fashion from the ankle to the tourniquet. Time-out procedure was performed. The limb was wrapped  with an Esmarch bandage and the tourniquet inflated to 350 mmHg. We began the operation by making the anterior midline incision starting at handbreadth above the patella going over the patella 1 cm medial to and  4 cm distal to the tibial tubercle. Small bleeders in the skin and the  subcutaneous tissue identified and cauterized. Transverse retinaculum was incised and reflected medially and a medial parapatellar arthrotomy was accomplished. the patella was everted and theprepatellar fat pad resected. The superficial medial collateral  ligament was then elevated from anterior to posterior along the proximal  flare of the tibia and anterior half of the menisci resected. The knee was hyperflexed exposing bone on bone arthritis. Peripheral and notch osteophytes as well as the cruciate ligaments were then resected. We continued to  work our way around posteriorly along the proximal tibia, and externally  rotated the tibia subluxing it out from underneath the  femur. A McHale  retractor was placed through the notch and a lateral Hohmann retractor  placed, and we then drilled through the proximal tibia in line with the  axis of the tibia followed by an intramedullary guide rod and 2-degree  posterior slope cutting guide. The tibial cutting guide was pinned into place  allowing resection of 5 mm of bone medially and about 10 mm of bone  laterally because of his varus deformity. Satisfied with the tibial resection, we then  entered the distal femur 2 mm anterior to the PCL origin with the  intramedullary guide rod and applied the distal femoral cutting guide  set at 11mm, with 5 degrees of valgus. This was pinned along the  epicondylar axis. At this point, the distal femoral cut was accomplished without difficulty. We then sized for a #5L femoral component and pinned the guide in 3 degrees of external rotation.The chamfer cutting guide was pinned into place. The anterior, posterior, and chamfer cuts were  accomplished without difficulty followed by  the Sigma RP box cutting guide and the box cut. We also removed posterior osteophytes from the posterior femoral condyles. At this  time, the knee was brought into full extension. We checked our  extension and flexion gaps and found them symmetric at 10mm.  The patella thickness measured at 26 mm. We set the cutting guide at 15 and removed the posterior 11 mm  of the patella sized for 41 button and drilled the lollipop. The knee  was then once again hyperflexed exposing the proximal tibia. We sized for a #5 tibial base plate, applied the smokestack and the conical reamer followed by the the Delta fin keel punch. We then hammered into place the Sigma RP trial femoral component, inserted a 10-mm trial bearing, trial patellar button, and took the knee through range of motion from 0-130 degrees. No thumb pressure was required for patellar  tracking. At this point, all trial components were removed, a double batch of DePuy HV cement with 1500 mg of Zinacef was mixed and applied to all bony metallic mating surfaces except for the posterior condyles of the femur itself. In order, we  hammered into place the tibial tray and removed excess cement, the femoral component and removed excess cement, a 10-mm Sigma RP bearing  was inserted, and the knee brought to full extension with compression.  The patellar button was clamped into place, and excess cement  removed. While the cement cured the wound was irrigated out with normal saline solution pulse lavage, and medium Hemovac drains were placed from an anterolateral  approach. Ligament stability and patellar tracking were checked and found to be excellent. The parapatellar arthrotomy was closed with  running #1 Vicryl suture. The subcutaneous tissue with 0 and 2-0 undyed  Vicryl suture, and the skin with skin staples. A dressing of Xeroform,  4 x 4, dressing sponges, Webril, and Ace wrap applied. The patient  awakened,  extubated, and taken to recovery room without difficulty.   [name] 07/09/2012, 2:32 PM

## 2012-07-09 NOTE — Progress Notes (Signed)
Dr. Gypsy Balsam notified that patient reported eating a "small piece of french toast" at 7 AM this morning.  Pt did not take metoprolol this AM at home, will order and give per anesthesia protocol.

## 2012-07-10 ENCOUNTER — Encounter (HOSPITAL_COMMUNITY): Payer: Self-pay | Admitting: General Practice

## 2012-07-10 LAB — BASIC METABOLIC PANEL
CO2: 26 mEq/L (ref 19–32)
Calcium: 9.1 mg/dL (ref 8.4–10.5)
Chloride: 98 mEq/L (ref 96–112)
Glucose, Bld: 233 mg/dL — ABNORMAL HIGH (ref 70–99)
Sodium: 132 mEq/L — ABNORMAL LOW (ref 135–145)

## 2012-07-10 LAB — CBC
Hemoglobin: 11.4 g/dL — ABNORMAL LOW (ref 13.0–17.0)
MCH: 31.1 pg (ref 26.0–34.0)
Platelets: 183 10*3/uL (ref 150–400)
RBC: 3.66 MIL/uL — ABNORMAL LOW (ref 4.22–5.81)
WBC: 8.9 10*3/uL (ref 4.0–10.5)

## 2012-07-10 LAB — GLUCOSE, CAPILLARY: Glucose-Capillary: 209 mg/dL — ABNORMAL HIGH (ref 70–99)

## 2012-07-10 MED ORDER — PNEUMOCOCCAL VAC POLYVALENT 25 MCG/0.5ML IJ INJ
0.5000 mL | INJECTION | INTRAMUSCULAR | Status: AC
  Administered 2012-07-11: 0.5 mL via INTRAMUSCULAR
  Filled 2012-07-10: qty 0.5

## 2012-07-10 NOTE — Progress Notes (Signed)
Patient ID: Corey Romero, male   DOB: 1950-09-22, 62 y.o.   MRN: [id number] PATIENT ID: Corey Romero  MRN: [id number]  DOB/AGE:  1950-12-07 / 62 y.o.  1 Day Post-Op Procedure(s) (LRB): TOTAL KNEE ARTHROPLASTY (Left)    PROGRESS NOTE Subjective: Patient is alert, oriented, no Nausea, no Vomiting, yes passing gas, no Bowel Movement. Taking PO well. Denies SOB, Chest or Calf Pain. Using Incentive Spirometer, PAS in place. Ambulate WBAT, CPM 0-60 Patient reports pain as 4 on 0-10 scale  .    Objective: Vital signs in last 24 hours: Filed Vitals:   07/09/12 1657 07/09/12 2203 07/10/12 0400 07/10/12 0655  BP: 132/75 131/65  124/62  Pulse:  78  82  Temp: 97.8 F (36.6 C) 98.4 F (36.9 C)  98 F (36.7 C)  TempSrc:      Resp: 12 18 18 18   SpO2: 99% 98% 98% 94%      Intake/Output from previous day: I/O last 3 completed shifts: In: 5200 [P.O.:4000; I.V.:1200] Out: 3250 [Urine:2200; Drains:1050]   Intake/Output this shift:     LABORATORY DATA:  Recent Labs  07/09/12 1513 07/09/12 1804 07/09/12 2209  GLUCAP 215* 212* 235*    Examination: Neurologically intact ABD soft Neurovascular intact Sensation intact distally Intact pulses distally Dorsiflexion/Plantar flexion intact Incision: scant drainage No cellulitis present Compartment soft} Blood and plasma separated in drain indicating minimal recent drainage, drain pulled without difficulty.  Assessment:   1 Day Post-Op Procedure(s) (LRB): TOTAL KNEE ARTHROPLASTY (Left) ADDITIONAL DIAGNOSIS:  Diabetes and Hypertension  Plan: PT/OT WBAT, CPM 5/hrs day until ROM 0-90 degrees, then D/C CPM DVT Prophylaxis:  SCDx72hrs, ASA 325 mg BID x 2 weeks DISCHARGE PLAN: Home, probably tomorrow DISCHARGE NEEDS: HHPT, HHRN, CPM, Walker and 3-in-1 comode seat     Shequita Peplinski J 07/10/2012, 7:38 AM

## 2012-07-10 NOTE — Progress Notes (Signed)
Physical Therapy Treatment Patient Details Name: Corey Romero MRN: [id number] DOB: 1951-04-03 Today's Date: 07/10/2012 Time: 0272-5366 PT Time Calculation (min): 41 min  PT Assessment / Plan / Recommendation Comments on Treatment Session  Pt mobility improving. Continues to present with quad weakness.     Follow Up Recommendations  Home health PT;Supervision - Intermittent     Does the patient have the potential to tolerate intense rehabilitation     Barriers to Discharge        Equipment Recommendations  None recommended by PT    Recommendations for Other Services OT consult  Frequency 7X/week   Plan Discharge plan remains appropriate;Frequency remains appropriate    Precautions / Restrictions Precautions Precautions: Knee Restrictions Weight Bearing Restrictions: Yes LLE Weight Bearing: Weight bearing as tolerated   Pertinent Vitals/Pain 4/10 pain in knee. No intervention required per pt.      Mobility  Bed Mobility Bed Mobility: Supine to Sit Supine to Sit: 4: Min assist;HOB flat Details for Bed Mobility Assistance: Min assist for LLE.  Cues for movement sequencing.  Transfers Transfers: Sit to Stand;Stand to Sit Sit to Stand: 4: Min guard;From chair/3-in-1;From bed;With upper extremity assist Stand to Sit: 4: Min guard;To chair/3-in-1;With upper extremity assist Details for Transfer Assistance: VCs for technique.  Ambulation/Gait Ambulation/Gait Assistance: 4: Min guard Ambulation Distance (Feet): 80 Feet Assistive device: Rolling walker Ambulation/Gait Assistance Details: VCs for gait sequencing.  Gait Pattern: Step-to pattern;Decreased stance time - left;Decreased step length - right;Decreased hip/knee flexion - right;Decreased weight shift to left;Antalgic;Trunk flexed Stairs: No    Exercises Total Joint Exercises Ankle Circles/Pumps: 10 reps;Both;AROM Quad Sets: Left;5 reps;AROM;Seated Short Arc Quad: Left;5 reps;AAROM;Seated   PT Diagnosis:    PT  Problem List:   PT Treatment Interventions:     PT Goals Acute Rehab PT Goals PT Goal Formulation: With patient Time For Goal Achievement: 07/17/12 Potential to Achieve Goals: Good Pt will go Supine/Side to Sit: with modified independence;with HOB 0 degrees PT Goal: Supine/Side to Sit - Progress: Progressing toward goal Pt will go Sit to Supine/Side: with modified independence;with HOB 0 degrees PT Goal: Sit to Supine/Side - Progress: Progressing toward goal Pt will go Sit to Stand: with modified independence;with mod assist PT Goal: Sit to Stand - Progress: Progressing toward goal Pt will go Stand to Sit: with modified independence;with upper extremity assist PT Goal: Stand to Sit - Progress: Progressing toward goal Pt will Transfer Bed to Chair/Chair to Bed: with modified independence PT Transfer Goal: Bed to Chair/Chair to Bed - Progress: Progressing toward goal Pt will Ambulate: 51 - 150 feet;with modified independence;with least restrictive assistive device PT Goal: Ambulate - Progress: Progressing toward goal Pt will Go Up / Down Stairs: 3-5 stairs;with min assist;with crutches Pt will Perform Home Exercise Program: Independently PT Goal: Perform Home Exercise Program - Progress: Progressing toward goal  Visit Information  Last PT Received On: 07/10/12    Subjective Data  Subjective: Agree    Cognition  Cognition Overall Cognitive Status: Appears within functional limits for tasks assessed/performed Arousal/Alertness: Awake/alert Orientation Level: Appears intact for tasks assessed Behavior During Session: [facility] for tasks performed    Balance     End of Session PT - End of Session Equipment Utilized During Treatment: Gait belt Activity Tolerance: Patient tolerated treatment well Patient left: in chair;with call bell/phone within reach Nurse Communication: Mobility status CPM Left Knee CPM Left Knee: Off   GP     Zafir Schauer 07/10/2012, 5:21 PM Brehanna Deveny L. Chilton Si DPT  [phone]    

## 2012-07-10 NOTE — Progress Notes (Signed)
SW will await PT/OT recommendations for disposition determination.  Sabino Niemann, MSW,  [phone]

## 2012-07-10 NOTE — Progress Notes (Signed)
CARE MANAGEMENT NOTE 07/10/2012  Patient:  Corey Romero, Corey Romero   Account Number:  [id number]  Date Initiated:  07/10/2012  Documentation initiated by:  Vance Peper  Subjective/Objective Assessment:   62 yr old male s/p left total knee arthroplasty.     Action/Plan:   patient preoperatively setup with [facility]. DME has been delivered.   Anticipated DC Date:     Anticipated DC Plan:  HOME W HOME HEALTH SERVICES      DC Planning Services  CM consult      [facility] Choice  HOME HEALTH   Choice offered to / List presented to:          [facility] arranged  HH-2 PT      [facility] agency  [facility]   Status of service:  In process, will continue to follow Medicare Important Message given?   (If response is "NO", the following Medicare IM given date fields will be blank) Date Medicare IM given:   Date Additional Medicare IM given:    Discharge Disposition:    Per UR Regulation:    If discussed at Long Length of Stay Meetings, dates discussed:    Comments:

## 2012-07-10 NOTE — Anesthesia Postprocedure Evaluation (Signed)
  Anesthesia Post-op Note  Patient: Corey Romero  Procedure(s) Performed: Procedure(s): TOTAL KNEE ARTHROPLASTY (Left)  Patient Location: Nursing Unit  Anesthesia Type:GA combined with regional for post-op pain  Level of Consciousness: awake, alert , oriented and patient cooperative  Airway and Oxygen Therapy: Patient Spontanous Breathing  Post-op Pain: mild  Post-op Assessment: Post-op Vital signs reviewed, Patient's Cardiovascular Status Stable, Respiratory Function Stable, Patent Airway and No signs of Nausea or vomiting  Post-op Vital Signs: Reviewed and stable  Complications: No apparent anesthesia complications

## 2012-07-10 NOTE — Progress Notes (Signed)
UR COMPLETED  

## 2012-07-10 NOTE — Evaluation (Signed)
Physical Therapy Evaluation Patient Details Name: Corey Romero MRN: [id number] DOB: 1950/08/07 Today's Date: 07/10/2012 Time: 4098-1191 PT Time Calculation (min): 38 min  PT Assessment / Plan / Recommendation Clinical Impression  Pt is a 62 y/o male s/p L TKA,  Pt doing well with mobility and should progress well with PT.  Plan to d/c home with HHPT and support of family.    PT Assessment  Patient needs continued PT services    Follow Up Recommendations  Home health PT;Supervision - Intermittent    Does the patient have the potential to tolerate intense rehabilitation      Barriers to Discharge None      Equipment Recommendations  None recommended by PT    Recommendations for Other Services OT consult   Frequency 7X/week    Precautions / Restrictions Precautions Precautions: Knee Restrictions Weight Bearing Restrictions: Yes LLE Weight Bearing: Weight bearing as tolerated   Pertinent Vitals/Pain 4/10 pain in knee. Pt medicated prior to session.       Mobility  Bed Mobility Bed Mobility: Supine to Sit Supine to Sit: 4: Min assist;HOB flat Details for Bed Mobility Assistance: Min assist for LLE.  Cues for movement sequencing.  Transfers Transfers: Sit to Stand;Stand to Sit Sit to Stand: 4: Min assist;From bed;With upper extremity assist Stand to Sit: 4: Min assist;To chair/3-in-1;With upper extremity assist Details for Transfer Assistance: Verbal cues for technique.  Assist to initiate standing secondary to generalized weakness, assist for controlled descent to chair. Vcs for hand and LLE placement.  Ambulation/Gait Ambulation/Gait Assistance: 4: Min assist Ambulation Distance (Feet): 15 Feet Assistive device: Rolling walker Ambulation/Gait Assistance Details: VCs for gait sequencing and to take a smoother step with R LE.   Gait Pattern: Step-to pattern;Decreased stance time - left;Decreased step length - right;Decreased hip/knee flexion - right;Decreased weight  shift to left;Antalgic;Trunk flexed Stairs: No Wheelchair Mobility Wheelchair Mobility: No    Exercises Total Joint Exercises Ankle Circles/Pumps: 10 reps;Both;AROM Quad Sets: Left;5 reps;AROM;Seated   PT Diagnosis: Difficulty walking;Generalized weakness;Acute pain  PT Problem List: Decreased strength;Decreased range of motion;Decreased activity tolerance;Decreased mobility;Decreased cognition;Decreased knowledge of precautions;Pain;Obesity PT Treatment Interventions: Gait training;DME instruction;Functional mobility training;Therapeutic activities;Therapeutic exercise;Patient/family education   PT Goals Acute Rehab PT Goals PT Goal Formulation: With patient Time For Goal Achievement: 07/17/12 Potential to Achieve Goals: Good Pt will go Supine/Side to Sit: with modified independence;with HOB 0 degrees PT Goal: Supine/Side to Sit - Progress: Goal set today Pt will go Sit to Supine/Side: with modified independence;with HOB 0 degrees PT Goal: Sit to Supine/Side - Progress: Goal set today Pt will go Sit to Stand: with modified independence;with mod assist PT Goal: Sit to Stand - Progress: Goal set today Pt will go Stand to Sit: with modified independence;with upper extremity assist PT Goal: Stand to Sit - Progress: Goal set today Pt will Transfer Bed to Chair/Chair to Bed: with modified independence PT Transfer Goal: Bed to Chair/Chair to Bed - Progress: Goal set today Pt will Ambulate: 51 - 150 feet;with modified independence;with least restrictive assistive device PT Goal: Ambulate - Progress: Goal set today Pt will Go Up / Down Stairs: 3-5 stairs;with min assist;with crutches PT Goal: Up/Down Stairs - Progress: Goal set today Pt will Perform Home Exercise Program: Independently PT Goal: Perform Home Exercise Program - Progress: Goal set today  Visit Information  Last PT Received On: 07/10/12 Assistance Needed: +1    Subjective Data  Subjective: Agree  Patient Stated Goal: Be  able to play golf  Prior Functioning  Home Living Lives With: Alone Available Help at Discharge: Family;Friend(s);Available 24 hours/day Type of Home: House Home Access: Stairs to enter [employer] of Steps: 3 Entrance Stairs-Rails: Right;Left Home Layout: One level Home Adaptive Equipment: Bedside commode/3-in-1;Walker - rolling Prior Function Level of Independence: Independent Able to Take Stairs?: Yes Driving: Yes Vocation: Retired    Copywriter, advertising Overall Cognitive Status: Appears within functional limits for tasks assessed/performed Arousal/Alertness: Awake/alert Orientation Level: Appears intact for tasks assessed Behavior During Session: [facility] for tasks performed    Extremity/Trunk Assessment Right Upper Extremity Assessment RUE ROM/Strength/Tone: Within functional levels Left Upper Extremity Assessment Corey ROM/Strength/Tone: Within functional levels Right Lower Extremity Assessment RLE ROM/Strength/Tone: Deficits RLE ROM/Strength/Tone Deficits: strength and ROM limited secondary to OA. Knee popping and grinding with mobility.   Left Lower Extremity Assessment LLE ROM/Strength/Tone: Due to pain;Deficits LLE ROM/Strength/Tone Deficits: ROM and strength limited secondary to surgery   Balance Balance Balance Assessed: No  End of Session PT - End of Session Equipment Utilized During Treatment: Gait belt Activity Tolerance: Patient tolerated treatment well Patient left: in chair;with call bell/phone within reach Nurse Communication: Mobility status CPM Left Knee CPM Left Knee: On Left Knee Flexion (Degrees): 60 Left Knee Extension (Degrees): 0  GP     Stefon Ramthun 07/10/2012, 1:03 PM Manish Ruggiero L. [place] Osentoski DPT [phone]

## 2012-07-11 DIAGNOSIS — E119 Type 2 diabetes mellitus without complications: Secondary | ICD-10-CM

## 2012-07-11 HISTORY — DX: Type 2 diabetes mellitus without complications: E11.9

## 2012-07-11 LAB — CBC
HCT: 29.4 % — ABNORMAL LOW (ref 39.0–52.0)
Hemoglobin: 9.9 g/dL — ABNORMAL LOW (ref 13.0–17.0)
MCV: 91.9 fL (ref 78.0–100.0)
WBC: 9.8 10*3/uL (ref 4.0–10.5)

## 2012-07-11 LAB — GLUCOSE, CAPILLARY
Glucose-Capillary: 216 mg/dL — ABNORMAL HIGH (ref 70–99)
Glucose-Capillary: 214 mg/dL — ABNORMAL HIGH (ref 70–99)
Glucose-Capillary: 147 mg/dL — ABNORMAL HIGH (ref 70–99)
Glucose-Capillary: 131 mg/dL — ABNORMAL HIGH (ref 70–99)

## 2012-07-11 MED ORDER — METHOCARBAMOL 500 MG PO TABS: 500.0000 mg | ORAL_TABLET | Freq: Four times a day (QID) | ORAL | Status: DC | PRN

## 2012-07-11 MED ORDER — OXYCODONE-ACETAMINOPHEN 10-325 MG PO TABS: 1.0000 | ORAL_TABLET | ORAL | Status: AC | PRN

## 2012-07-11 MED ORDER — ASPIRIN 325 MG PO TBEC: 325.0000 mg | DELAYED_RELEASE_TABLET | Freq: Two times a day (BID) | ORAL | Status: DC

## 2012-07-11 NOTE — Plan of Care (Signed)
Problem: Phase II Progression Outcomes Goal: Discharge plan established Recommend 3 in 1, tub bench, ADL A/E kit and HH OT for ADL trg and ADL mobility safety trg after acute care d/c

## 2012-07-11 NOTE — Progress Notes (Signed)
PATIENT ID: LAITH ANTONELLI  MRN: [id number]  DOB/AGE:  1950-12-16 / 62 y.o.  2 Days Post-Op Procedure(s) (LRB): TOTAL KNEE ARTHROPLASTY (Left)    PROGRESS NOTE Subjective: Patient is alert, oriented, no Nausea, no Vomiting, yes passing gas, no Bowel Movement. Taking PO well. Denies SOB, Chest or Calf Pain. Using Incentive Spirometer, PAS in place. Ambulating well with PT. Patient reports pain as moderate  .    Objective: Vital signs in last 24 hours: Filed Vitals:   07/10/12 2000 07/10/12 2149 07/11/12 0000 07/11/12 0523  BP:  129/64  135/79  Pulse:  93  94  Temp:  98.6 F (37 C)  98.1 F (36.7 C)  TempSrc:      Resp: 16 18 18 18   SpO2: 96% 96%  97%      Intake/Output from previous day: I/O last 3 completed shifts: In: 6120 [P.O.:6120] Out: 3675 [Urine:3225; Drains:450]   Intake/Output this shift:     LABORATORY DATA:  Recent Labs  07/10/12 0610  07/10/12 1632 07/10/12 2153 07/11/12 0513 07/11/12 0648  WBC 8.9  --   --   --  9.8  --   HGB 11.4*  --   --   --  9.9*  --   HCT 33.7*  --   --   --  29.4*  --   PLT 183  --   --   --  166  --   NA 132*  --   --   --   --   --   K 4.4  --   --   --   --   --   CL 98  --   --   --   --   --   CO2 26  --   --   --   --   --   BUN 15  --   --   --   --   --   CREATININE 0.88  --   --   --   --   --   GLUCOSE 233*  --   --   --   --   --   GLUCAP  --   < > 209* 213*  --  216*  CALCIUM 9.1  --   --   --   --   --   < > = values in this interval not displayed.  Examination: Neurologically intact ABD soft Neurovascular intact Sensation intact distally Intact pulses distally Dorsiflexion/Plantar flexion intact Incision: dressing C/D/I}  Assessment:   2 Days Post-Op Procedure(s) (LRB): TOTAL KNEE ARTHROPLASTY (Left) ADDITIONAL DIAGNOSIS:  Diabetes  Plan: PT/OT WBAT, CPM 5/hrs day until ROM 0-90 degrees, then D/C CPM Dressing change today DVT Prophylaxis:  SCDx72hrs, ASA 325 mg BID x 2 weeks DISCHARGE PLAN:  Home Saturday DISCHARGE NEEDS: HHPT, HHRN, CPM, Walker and 3-in-1 comode seat     Ashutosh Dieguez M. 07/11/2012, 10:30 AM

## 2012-07-11 NOTE — Evaluation (Signed)
Occupational Therapy Evaluation Patient Details Name: Corey Romero MRN: [id number] DOB: 06-01-1950 Today's Date: 07/11/2012 Time: 7829-5621 OT Time Calculation (min): 25 min  OT Assessment / Plan / Recommendation Clinical Impression  Pt demos decline in function with LB ADLs, balance, safety and activity tolerance following L knee surgery. Pt would benefit from OT services to address these impairments to help retsore PLOF t return home safely    OT Assessment  Patient needs continued OT Services    Follow Up Recommendations  Home health OT    Barriers to Discharge None    Equipment Recommendations  Tub/shower bench;Other (comment);3 in 1 bedside comode (ADL A/E kit)    Recommendations for Other Services    Frequency  Min 2X/week    Precautions / Restrictions Precautions Precautions: Knee Restrictions Weight Bearing Restrictions: Yes LLE Weight Bearing: Weight bearing as tolerated   Pertinent Vitals/Pain     ADL  Grooming: Performed;Wash/dry hands;Wash/dry face;Min guard Where Assessed - Grooming: Supported standing Upper Body Bathing: Simulated;Supervision/safety;Set up Lower Body Bathing: Simulated;Moderate assistance Upper Body Dressing: Performed;Supervision/safety;Set up Lower Body Dressing: Performed;Moderate assistance Toilet Transfer: Performed;Minimal assistance Toilet Transfer Method: Sit to stand Toilet Transfer Equipment: Raised toilet seat with arms (or 3-in-1 over toilet) Toileting - Clothing Manipulation and Hygiene: Performed;Min guard;Minimal assistance Where Assessed - Toileting Clothing Manipulation and Hygiene: Standing Transfers/Ambulation Related to ADLs: verbal cues for correct hand placement ADL Comments: Pt provided with education and demo of ADL A/E and tub bench for use at home    OT Diagnosis: Generalized weakness;Acute pain  OT Problem List: Decreased knowledge of use of DME or AE;Decreased activity tolerance;Impaired balance (sitting  and/or standing);Pain OT Treatment Interventions: Self-care/ADL training;Balance training;Therapeutic exercise;Neuromuscular education;Therapeutic activities;DME and/or AE instruction;Patient/family education   OT Goals Acute Rehab OT Goals OT Goal Formulation: With patient Time For Goal Achievement: 07/18/12 Potential to Achieve Goals: Good ADL Goals Pt Will Perform Grooming: with set-up;with supervision;Standing at sink ADL Goal: Grooming - Progress: Goal set today Pt Will Perform Lower Body Bathing: with min assist;with adaptive equipment ADL Goal: Lower Body Bathing - Progress: Goal set today Pt Will Perform Lower Body Dressing: with min assist;with adaptive equipment ADL Goal: Lower Body Dressing - Progress: Goal set today Pt Will Transfer to Toilet: with supervision;with DME ADL Goal: Toilet Transfer - Progress: Goal set today Pt Will Perform Toileting - Clothing Manipulation: with supervision;Standing ADL Goal: Toileting - Clothing Manipulation - Progress: Goal set today Pt Will Perform Tub/Shower Transfer: Tub transfer;Transfer tub bench;with DME;with supervision ADL Goal: Tub/Shower Transfer - Progress: Goal set today  Visit Information  Last OT Received On: 07/11/12 Assistance Needed: +1    Subjective Data  Subjective: " I need to stay one more day then go home tomorrow " Patient Stated Goal: To return home   Prior Functioning     Home Living Lives With: Alone Available Help at Discharge: Family;Friend(s);Available 24 hours/day Type of Home: House Home Access: Stairs to enter [employer] of Steps: 3 Entrance Stairs-Rails: Right;Left Home Layout: One level Firefighter: Standard Home Adaptive Equipment: Bedside commode/3-in-1;Walker - rolling Prior Function Level of Independence: Independent Able to Take Stairs?: Yes Driving: Yes Vocation: Retired Musician: No difficulties Dominant Hand: Right          Vision/Perception Vision - History Baseline Vision: Wears glasses only for reading Patient Visual Report: No change from baseline Perception Perception: Within Functional Limits   Cognition  Cognition Overall Cognitive Status: Appears within functional limits for tasks assessed/performed Arousal/Alertness: Awake/alert Orientation Level:  Oriented X4 / Intact Behavior During Session: [facility] for tasks performed    Extremity/Trunk Assessment Right Upper Extremity Assessment RUE ROM/Strength/Tone: [facility] for tasks assessed Left Upper Extremity Assessment LUE ROM/Strength/Tone: WFL for tasks assessed     Mobility Bed Mobility Bed Mobility: Not assessed Supine to Sit: 4: Min assist;HOB flat Details for Bed Mobility Assistance: Min assist for LLE.  Cues for movement sequencing.  Transfers Transfers: Stand to Sit;Sit to Stand Sit to Stand: 4: Min guard;From chair/3-in-1;From bed;With upper extremity assist Stand to Sit: 4: Min guard;To chair/3-in-1;With upper extremity assist Details for Transfer Assistance: Cues for safe hand placement. Patient uses on hand on chair and one hand on walker technique safely        Balance Balance Balance Assessed: No   End of Session OT - End of Session Equipment Utilized During Treatment: Gait belt (RW, 3 in 1, tub bench, ADL A/E) Activity Tolerance: Patient tolerated treatment well Patient left: in chair;with call bell/phone within reach  GO     Galen Manila 07/11/2012, 11:24 AM

## 2012-07-11 NOTE — Progress Notes (Signed)
Physical Therapy Treatment Patient Details Name: Corey Romero MRN: [id number] DOB: December 21, 1950 Today's Date: 07/11/2012 Time: 7829-5621 PT Time Calculation (min): 29 min  PT Assessment / Plan / Recommendation Comments on Treatment Session  Patient progressing slowly. Would benefit for one more night in hospital to attempt stair training later today and to ensure safety with another session in AM. Patient agrees with this plan if ok with MD I would recommend. Conintue with current POC and to attempt stairs this afternoon    Follow Up Recommendations  Home health PT;Supervision - Intermittent     Does the patient have the potential to tolerate intense rehabilitation     Barriers to Discharge        Equipment Recommendations  None recommended by PT    Recommendations for Other Services    Frequency 7X/week   Plan Discharge plan remains appropriate;Frequency remains appropriate    Precautions / Restrictions Precautions Precautions: Knee Restrictions LLE Weight Bearing: Weight bearing as tolerated   Pertinent Vitals/Pain no apparent distress     Mobility  Bed Mobility Supine to Sit: 4: Min assist;HOB flat Details for Bed Mobility Assistance: Min assist for LLE.  Cues for movement sequencing.  Transfers Sit to Stand: 4: Min guard;From chair/3-in-1;From bed;With upper extremity assist Stand to Sit: 4: Min guard;To chair/3-in-1;With upper extremity assist Details for Transfer Assistance: Cues for safe hand placement. Patient uses on hand on chair and one hand on walker technique safely Ambulation/Gait Ambulation/Gait Assistance: 4: Min guard Ambulation Distance (Feet): 80 Feet Assistive device: Rolling walker Ambulation/Gait Assistance Details: Cues for heel strike Gait Pattern: Step-through pattern;Decreased stride length;Trunk flexed Gait velocity: decreased intially but progressing    Exercises Total Joint Exercises Quad Sets: Left;AROM;Seated;10 reps Heel Slides:  AAROM;Left;10 reps Hip ABduction/ADduction: AAROM;Left;10 reps Straight Leg Raises: AAROM;Left;5 reps   PT Diagnosis:    PT Problem List:   PT Treatment Interventions:     PT Goals Acute Rehab PT Goals PT Goal: Supine/Side to Sit - Progress: Progressing toward goal PT Goal: Sit to Stand - Progress: Progressing toward goal PT Goal: Stand to Sit - Progress: Progressing toward goal PT Transfer Goal: Bed to Chair/Chair to Bed - Progress: Progressing toward goal PT Goal: Ambulate - Progress: Progressing toward goal PT Goal: Perform Home Exercise Program - Progress: Progressing toward goal  Visit Information  Assistance Needed: +1    Subjective Data      Cognition  Cognition Overall Cognitive Status: Appears within functional limits for tasks assessed/performed Arousal/Alertness: Awake/alert Orientation Level: Appears intact for tasks assessed Behavior During Session: [facility] for tasks performed    Balance     End of Session PT - End of Session Equipment Utilized During Treatment: Gait belt Activity Tolerance: Patient tolerated treatment well Patient left: in chair;with call bell/phone within reach Nurse Communication: Mobility status   GP     Fredrich Birks 07/11/2012, 9:38 AM  07/11/2012 Fredrich Birks PTA [phone] pager [phone] office

## 2012-07-11 NOTE — Progress Notes (Signed)
Physical Therapy Treatment Patient Details Name: SABASTION HRDLICKA MRN: [id number] DOB: [birth year] Today's Date: 07/11/2012 Time: 1345-1410 PT Time Calculation (min): 25 min  PT Assessment / Plan / Recommendation Comments on Treatment Session  Patient progressing very well with ambulation this session. Did not feel like he could attempt steps this afternoon. Will do first thing in the AM and will anticipate discharge home    Follow Up Recommendations  Home health PT;Supervision - Intermittent     Does the patient have the potential to tolerate intense rehabilitation     Barriers to Discharge        Equipment Recommendations  None recommended by PT    Recommendations for Other Services    Frequency 7X/week   Plan Discharge plan remains appropriate;Frequency remains appropriate    Precautions / Restrictions Precautions Precautions: Knee Restrictions Weight Bearing Restrictions: Yes LLE Weight Bearing: Weight bearing as tolerated   Pertinent Vitals/Pain     Mobility  Bed Mobility Bed Mobility: Sit to Supine Supine to Sit: 4: Min assist;HOB flat Sit to Supine: 4: Min guard Details for Bed Mobility Assistance: Min assist for LLE.  Cues for movement sequencing.  Transfers Sit to Stand: 4: Min guard;From chair/3-in-1;From bed;With upper extremity assist Stand to Sit: To chair/3-in-1;With upper extremity assist;5: Supervision Details for Transfer Assistance: Patient uses on hand on chair and one hand on walker technique safely. Patient able to sit with more control this session Ambulation/Gait Ambulation/Gait Assistance: 5: Supervision Ambulation Distance (Feet): 160 Feet Assistive device: Rolling walker Gait Pattern: Step-through pattern Gait velocity: WFL Stairs: No    Exercises Total Joint Exercises Ankle Circles/Pumps: 10 reps;Both;AROM Quad Sets: Left;AROM;Seated;10 reps Heel Slides: Left;10 reps;AROM Hip ABduction/ADduction: AROM;Left;10 reps Straight Leg Raises:  AAROM;Left;10 reps   PT Diagnosis:    PT Problem List:   PT Treatment Interventions:     PT Goals Acute Rehab PT Goals PT Goal: Supine/Side to Sit - Progress: Progressing toward goal PT Goal: Sit to Supine/Side - Progress: Progressing toward goal PT Goal: Sit to Stand - Progress: Progressing toward goal PT Goal: Stand to Sit - Progress: Progressing toward goal PT Transfer Goal: Bed to Chair/Chair to Bed - Progress: Progressing toward goal PT Goal: Ambulate - Progress: Progressing toward goal PT Goal: Perform Home Exercise Program - Progress: Progressing toward goal  Visit Information  Last PT Received On: 07/11/12 Assistance Needed: +1    Subjective Data      Cognition  Cognition Overall Cognitive Status: Appears within functional limits for tasks assessed/performed Arousal/Alertness: Awake/alert Orientation Level: Oriented X4 / Intact Behavior During Session: [facility] for tasks performed    Balance  Balance Balance Assessed: No  End of Session PT - End of Session Equipment Utilized During Treatment: Gait belt Activity Tolerance: Patient tolerated treatment well Patient left: with call bell/phone within reach;in bed Nurse Communication: Mobility status   GP     Fredrich Birks 07/11/2012, 2:20 PM 07/11/2012 Fredrich Birks PTA [phone] pager [phone] office

## 2012-07-11 NOTE — Progress Notes (Signed)
   CARE MANAGEMENT NOTE 07/11/2012  Patient:  Corey Romero, Corey Romero   Account Number:  [id number]  Date Initiated:  07/10/2012  Documentation initiated by:  Vance Peper  Subjective/Objective Assessment:   62 yr old male s/p left total knee arthroplasty.     Action/Plan:   patient preoperatively setup with [facility]. DME has been delivered.   Anticipated DC Date:  07/11/2012   Anticipated DC Plan:  HOME W HOME HEALTH SERVICES      DC Planning Services  CM consult      [facility] Choice  HOME HEALTH   Choice offered to / List presented to:  C-1 Patient   DME arranged  3-N-1  WALKER - ROLLING  CPM      DME agency  [employer]     HH arranged  HH-2 PT      HH agency  [facility]   Status of service:  Completed, signed off Medicare Important Message given?   (If response is "NO", the following Medicare IM given date fields will be blank) Date Medicare IM given:   Date Additional Medicare IM given:    Discharge Disposition:  HOME W HOME HEALTH SERVICES  Per UR Regulation:    If discussed at Long Length of Stay Meetings, dates discussed:    Comments:  07/11/2012 1430 NCM spoke to pt and his dtr, Corey Romero # [phone] is home for Spring Break to assist him at home. Spoke to [employer] rep and they will deliver his CPM once he goes home. Pt has RW and bedside commode in his room. [facility] and [employer] contact number on dc instructions. Corey Donning RN CCM Case Mgmt phone [phone]

## 2012-07-11 NOTE — Progress Notes (Signed)
[facility]  Patient Status: New  [facility] is providing the following services: PT - noted plan for d/c tomorrow.  On the schedule for a PT visit at home on Sunday  If patient discharges after hours, please call [phone].   Jodene Nam 07/11/2012, 11:51 AM

## 2012-07-12 LAB — CBC
Hemoglobin: 9.8 g/dL — ABNORMAL LOW (ref 13.0–17.0)
Platelets: 168 10*3/uL (ref 150–400)
RBC: 3.09 MIL/uL — ABNORMAL LOW (ref 4.22–5.81)
WBC: 9.1 10*3/uL (ref 4.0–10.5)

## 2012-07-12 NOTE — Progress Notes (Signed)
Physical Therapy Treatment Patient Details Name: Corey Romero MRN: [id number] DOB: 1951-04-14 Today's Date: 07/12/2012 Time: 1478-2956 PT Time Calculation (min): 30 min  PT Assessment / Plan / Recommendation Comments on Treatment Session  Patient progressing well and motivated. Able to complete stair training this morning    Follow Up Recommendations  Home health PT;Supervision - Intermittent     Does the patient have the potential to tolerate intense rehabilitation     Barriers to Discharge        Equipment Recommendations  None recommended by PT    Recommendations for Other Services    Frequency 7X/week   Plan Discharge plan remains appropriate;Frequency remains appropriate    Precautions / Restrictions Precautions Precautions: Knee Restrictions Weight Bearing Restrictions: Yes LLE Weight Bearing: Weight bearing as tolerated   Pertinent Vitals/Pain     Mobility  Bed Mobility Supine to Sit: 4: Min guard;HOB flat Transfers Sit to Stand: 4: Min guard;From chair/3-in-1;From bed;With upper extremity assist Stand to Sit: To chair/3-in-1;With upper extremity assist;5: Supervision Details for Transfer Assistance: Patient uses on hand on chair and one hand on walker technique safely Ambulation/Gait Ambulation/Gait Assistance: 5: Supervision Ambulation Distance (Feet): 100 Feet Assistive device: Rolling walker Gait Pattern: Step-through pattern;Decreased stride length Stairs: Yes Stairs Assistance: 4: Min guard Stairs Assistance Details (indicate cue type and reason): Cues for technique and sequency Stair Management Technique: One rail Left Number of Stairs: 2    Exercises     PT Diagnosis:    PT Problem List:   PT Treatment Interventions:     PT Goals Acute Rehab PT Goals PT Goal: Supine/Side to Sit - Progress: Progressing toward goal PT Goal: Sit to Stand - Progress: Progressing toward goal PT Goal: Stand to Sit - Progress: Progressing toward goal PT Transfer  Goal: Bed to Chair/Chair to Bed - Progress: Progressing toward goal PT Goal: Ambulate - Progress: Progressing toward goal PT Goal: Up/Down Stairs - Progress: Progressing toward goal PT Goal: Perform Home Exercise Program - Progress: Progressing toward goal  Visit Information  Last PT Received On: 07/12/12 Assistance Needed: +1    Subjective Data      Cognition  Cognition Overall Cognitive Status: Appears within functional limits for tasks assessed/performed Arousal/Alertness: Awake/alert Orientation Level: Oriented X4 / Intact Behavior During Session: [name] for tasks performed    Balance     End of Session PT - End of Session Equipment Utilized During Treatment: Gait belt Activity Tolerance: Patient tolerated treatment well Patient left: with call bell/phone within reach;in chair Nurse Communication: Mobility status   GP     Fredrich Birks 07/12/2012, 10:35 AM 07/12/2012 Fredrich Birks PTA [phone] pager [phone] office
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
## 2012-07-12 NOTE — Progress Notes (Signed)
Patient ID: Corey Romero, male   DOB: [birth year], 62 y.o.   MRN: [id number] PATIENT ID: Corey Romero  MRN: [id number]  DOB/AGE:  1951-01-09 / 62 y.o.  3 Days Post-Op Procedure(s) (LRB): TOTAL KNEE ARTHROPLASTY (Left)    PROGRESS NOTE Subjective: Patient is alert, oriented, no Nausea, no Vomiting, yes passing gas, no Bowel Movement. Taking PO well. Denies SOB, Chest or Calf Pain. Using Incentive Spirometer, PAS in place. Ambulate in hallway without difficulty, CPM 0-80 Patient reports pain as 3 on 0-10 scale  .    Objective: Vital signs in last 24 hours: Filed Vitals:   07/11/12 2000 07/12/12 0000 07/12/12 0400 07/12/12 0705  BP:    106/64  Pulse:    74  Temp:    98.8 F (37.1 C)  TempSrc:      Resp: 18 18 16 18   SpO2:    100%      Intake/Output from previous day: I/O last 3 completed shifts: In: 1680 [P.O.:1680] Out: 1450 [Urine:1450]   Intake/Output this shift: Total I/O In: -  Out: 100 [Urine:100]   LABORATORY DATA:  Recent Labs  07/10/12 0610  07/11/12 0513  07/11/12 1630 07/11/12 2051 07/12/12 0638 07/12/12 0705  WBC 8.9  --  9.8  --   --   --   --  9.1  HGB 11.4*  --  9.9*  --   --   --   --  9.8*  HCT 33.7*  --  29.4*  --   --   --   --  28.4*  PLT 183  --  166  --   --   --   --  168  NA 132*  --   --   --   --   --   --   --   K 4.4  --   --   --   --   --   --   --   CL 98  --   --   --   --   --   --   --   CO2 26  --   --   --   --   --   --   --   BUN 15  --   --   --   --   --   --   --   CREATININE 0.88  --   --   --   --   --   --   --   GLUCOSE 233*  --   --   --   --   --   --   --   GLUCAP  --   < >  --   < > 147* 131* 172*  --   CALCIUM 9.1  --   --   --   --   --   --   --   < > = values in this interval not displayed.  Examination: Neurologically intact ABD soft Neurovascular intact Sensation intact distally Intact pulses distally Dorsiflexion/Plantar flexion intact Incision: no drainage No cellulitis present Compartment  soft}  Assessment:   3 Days Post-Op Procedure(s) (LRB): TOTAL KNEE ARTHROPLASTY (Left) ADDITIONAL DIAGNOSIS:  Diabetes and Hypertension  Plan: PT/OT WBAT, CPM 5/hrs day until ROM 0-90 degrees, then D/C CPM DVT Prophylaxis:  SCDx72hrs, ASA 325 mg BID x 2 weeks DISCHARGE PLAN: Home DISCHARGE NEEDS: HHPT, HHRN, CPM, Walker and 3-in-1 comode seat     Juliann Olesky J  07/12/2012, 8:07 AM

## 2012-10-24 ENCOUNTER — Emergency Department (HOSPITAL_BASED_OUTPATIENT_CLINIC_OR_DEPARTMENT_OTHER)
Admission: EM | Admit: 2012-10-24 | Discharge: 2012-10-24 | Disposition: A | Discharge status: Home / Self Care | Payer: BC Managed Care – PPO | Attending: Emergency Medicine | Admitting: Emergency Medicine

## 2012-10-24 ENCOUNTER — Encounter (HOSPITAL_BASED_OUTPATIENT_CLINIC_OR_DEPARTMENT_OTHER): Payer: Self-pay | Admitting: Emergency Medicine

## 2012-10-24 DIAGNOSIS — Z87828 Personal history of other (healed) physical injury and trauma: Secondary | ICD-10-CM | POA: Insufficient documentation

## 2012-10-24 DIAGNOSIS — M129 Arthropathy, unspecified: Secondary | ICD-10-CM | POA: Insufficient documentation

## 2012-10-24 DIAGNOSIS — F3289 Other specified depressive episodes: Secondary | ICD-10-CM | POA: Insufficient documentation

## 2012-10-24 DIAGNOSIS — F329 Major depressive disorder, single episode, unspecified: Secondary | ICD-10-CM | POA: Insufficient documentation

## 2012-10-24 DIAGNOSIS — Z79899 Other long term (current) drug therapy: Secondary | ICD-10-CM | POA: Insufficient documentation

## 2012-10-24 DIAGNOSIS — Z794 Long term (current) use of insulin: Secondary | ICD-10-CM | POA: Insufficient documentation

## 2012-10-24 DIAGNOSIS — Z87891 Personal history of nicotine dependence: Secondary | ICD-10-CM | POA: Insufficient documentation

## 2012-10-24 DIAGNOSIS — L03319 Cellulitis of trunk, unspecified: Secondary | ICD-10-CM | POA: Insufficient documentation

## 2012-10-24 DIAGNOSIS — L02219 Cutaneous abscess of trunk, unspecified: Secondary | ICD-10-CM | POA: Insufficient documentation

## 2012-10-24 DIAGNOSIS — Z87442 Personal history of urinary calculi: Secondary | ICD-10-CM | POA: Insufficient documentation

## 2012-10-24 DIAGNOSIS — Z7982 Long term (current) use of aspirin: Secondary | ICD-10-CM | POA: Insufficient documentation

## 2012-10-24 DIAGNOSIS — F411 Generalized anxiety disorder: Secondary | ICD-10-CM | POA: Insufficient documentation

## 2012-10-24 DIAGNOSIS — I2510 Atherosclerotic heart disease of native coronary artery without angina pectoris: Secondary | ICD-10-CM | POA: Insufficient documentation

## 2012-10-24 DIAGNOSIS — L02211 Cutaneous abscess of abdominal wall: Secondary | ICD-10-CM

## 2012-10-24 DIAGNOSIS — E119 Type 2 diabetes mellitus without complications: Secondary | ICD-10-CM | POA: Insufficient documentation

## 2012-10-24 MED ORDER — CEPHALEXIN 500 MG PO CAPS: 500.0000 mg | ORAL_CAPSULE | Freq: Four times a day (QID) | ORAL | Status: DC

## 2012-10-24 MED ORDER — SULFAMETHOXAZOLE-TRIMETHOPRIM 800-160 MG PO TABS: 1.0000 | ORAL_TABLET | Freq: Two times a day (BID) | ORAL | Status: DC
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
## 2012-10-24 NOTE — ED Notes (Signed)
Pt sts he noticed abcess to RLQ Wed PM, sensitive to touch. Opened and drained yesterday am. Erythema noted around open wound measuring 14cm x 7cm. No drainage at this time.  Has been using neosporin to wound.

## 2012-10-27 ENCOUNTER — Other Ambulatory Visit: Payer: Self-pay | Admitting: Orthopedic Surgery

## 2012-11-04 ENCOUNTER — Encounter (HOSPITAL_COMMUNITY): Payer: Self-pay

## 2012-11-10 ENCOUNTER — Encounter (HOSPITAL_COMMUNITY): Payer: Self-pay

## 2012-11-10 ENCOUNTER — Encounter (HOSPITAL_COMMUNITY)
Admission: RE | Admit: 2012-11-10 | Discharge: 2012-11-10 | Disposition: A | Discharge status: Home / Self Care | Payer: BC Managed Care – PPO | Source: Ambulatory Visit | Attending: Orthopedic Surgery | Admitting: Orthopedic Surgery

## 2012-11-10 DIAGNOSIS — Z01812 Encounter for preprocedural laboratory examination: Secondary | ICD-10-CM | POA: Insufficient documentation

## 2012-11-10 LAB — CBC WITH DIFFERENTIAL/PLATELET
Basophils Absolute: 0 10*3/uL (ref 0.0–0.1)
Basophils Relative: 1 % (ref 0–1)
HCT: 38.2 % — ABNORMAL LOW (ref 39.0–52.0)
MCHC: 33.8 g/dL (ref 30.0–36.0)
Monocytes Absolute: 0.9 10*3/uL (ref 0.1–1.0)
Neutro Abs: 3.5 10*3/uL (ref 1.7–7.7)
Neutrophils Relative %: 50 % (ref 43–77)
Platelets: 230 10*3/uL (ref 150–400)
RDW: 14.5 % (ref 11.5–15.5)

## 2012-11-10 LAB — TYPE AND SCREEN
ABO/RH(D): O POS
Antibody Screen: NEGATIVE

## 2012-11-10 LAB — BASIC METABOLIC PANEL
BUN: 20 mg/dL (ref 6–23)
Calcium: 9.7 mg/dL (ref 8.4–10.5)
Creatinine, Ser: 1.17 mg/dL (ref 0.50–1.35)
GFR calc Af Amer: 75 mL/min — ABNORMAL LOW (ref >90)
GFR calc non Af Amer: 65 mL/min — ABNORMAL LOW (ref >90)

## 2012-11-10 LAB — SURGICAL PCR SCREEN
MRSA, PCR: NEGATIVE
Staphylococcus aureus: NEGATIVE

## 2012-11-10 LAB — PROTIME-INR: Prothrombin Time: 13.5 seconds (ref 11.6–15.2)

## 2012-11-10 LAB — APTT: aPTT: 27 seconds (ref 24–37)

## 2012-11-10 NOTE — Progress Notes (Signed)
11/10/12 1214  OBSTRUCTIVE SLEEP APNEA  Have you ever been diagnosed with sleep apnea through a sleep study? No  Do you snore loudly (loud enough to be heard through closed doors)?  0  Do you often feel tired, fatigued, or sleepy during the daytime? 0  Has anyone observed you stop breathing during your sleep? 0  Do you have, or are you being treated for high blood pressure? 1  BMI more than 35 kg/m2? 1  Age over 63 years old? 1  Neck circumference greater than 40 cm/18 inches? 1  Gender: 1  Obstructive Sleep Apnea Score 5  Score 4 or greater  Results sent to PCP   This patient has screened at an elevated risk for sleep apnea using the STOP BANG tool used during a presurgical visit.  A score of 4 or greater is at risk for sleep apnea...Ashley Royalty  RN

## 2012-11-10 NOTE — Progress Notes (Addendum)
Now sees Dr. Anne Fu for his heart.  Will request Echo & Stress tests results.........DA  Will need UA  DOS...he left before we could get one..............DA

## 2012-11-10 NOTE — Pre-Procedure Instructions (Signed)
Corey Romero  11/10/2012   Your procedure is scheduled on:  Wednesday, July 9th  Report to [facility] Stay [facility] at 10:45 AM.             (you will come through Entrance "A", follow signs to EAST Elevators and take those to the 3rd Floor)   Call this number if you have problems the morning of surgery: [phone]   Remember:   Do not eat food or drink liquids after midnight Tuesday.   Take these medicines the morning of surgery with A SIP OF WATER: Zoloft, Oxycodone, Metoprolol, Xanax   Do not wear jewelry.  Do not wear lotions, powders, or colognes. You may NOT wear deodorant.   Men may shave face and neck.   Do not bring valuables to the hospital.  [facility] is not responsible for any belongings or valuables.  Contacts, dentures or bridgework may not be worn into surgery.   Leave suitcase in the car. After surgery it may be brought to your room.  For patients admitted to the hospital, checkout time is 11:00 AM the day of discharge.   Name and phone number of your driver:    Special Instructions: Shower using CHG 2 nights before surgery and the night before surgery.  If you shower the day of surgery use CHG.  Use special wash - you have one bottle of CHG for all showers.  You should use approximately 1/3 of the bottle for each shower.   Please read over the following fact sheets that you were given: Pain Booklet, Coughing and Deep Breathing, Blood Transfusion Information, MRSA Information and Surgical Site Infection Prevention
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
## 2012-11-18 MED ORDER — DEXTROSE 5 % IV SOLN
3.0000 g | INTRAVENOUS | Status: AC
  Administered 2012-11-19: 3 g via INTRAVENOUS
  Filled 2012-11-18: qty 3000
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
## 2012-11-19 ENCOUNTER — Inpatient Hospital Stay (HOSPITAL_COMMUNITY)
Admission: RE | Admit: 2012-11-19 | Discharge: 2012-11-23 | DRG: 209 | Disposition: A | Discharge status: Home / Self Care | Payer: BC Managed Care – PPO | Source: Ambulatory Visit | Attending: Orthopedic Surgery | Admitting: Orthopedic Surgery

## 2012-11-19 ENCOUNTER — Encounter (HOSPITAL_COMMUNITY): Payer: Self-pay | Admitting: Vascular Surgery

## 2012-11-19 ENCOUNTER — Ambulatory Visit (HOSPITAL_COMMUNITY): Payer: BC Managed Care – PPO | Admitting: Anesthesiology

## 2012-11-19 ENCOUNTER — Encounter (HOSPITAL_COMMUNITY)
Admission: RE | Disposition: A | Discharge status: Home / Self Care | Payer: Self-pay | Source: Ambulatory Visit | Attending: Orthopedic Surgery

## 2012-11-19 DIAGNOSIS — M1710 Unilateral primary osteoarthritis, unspecified knee: Principal | ICD-10-CM | POA: Diagnosis present

## 2012-11-19 DIAGNOSIS — F411 Generalized anxiety disorder: Secondary | ICD-10-CM | POA: Diagnosis present

## 2012-11-19 DIAGNOSIS — F3289 Other specified depressive episodes: Secondary | ICD-10-CM | POA: Diagnosis present

## 2012-11-19 DIAGNOSIS — Z87442 Personal history of urinary calculi: Secondary | ICD-10-CM

## 2012-11-19 DIAGNOSIS — E119 Type 2 diabetes mellitus without complications: Secondary | ICD-10-CM | POA: Diagnosis present

## 2012-11-19 DIAGNOSIS — F329 Major depressive disorder, single episode, unspecified: Secondary | ICD-10-CM | POA: Diagnosis present

## 2012-11-19 DIAGNOSIS — Z87891 Personal history of nicotine dependence: Secondary | ICD-10-CM

## 2012-11-19 DIAGNOSIS — Z794 Long term (current) use of insulin: Secondary | ICD-10-CM

## 2012-11-19 DIAGNOSIS — I2510 Atherosclerotic heart disease of native coronary artery without angina pectoris: Secondary | ICD-10-CM | POA: Diagnosis present

## 2012-11-19 DIAGNOSIS — M1711 Unilateral primary osteoarthritis, right knee: Secondary | ICD-10-CM

## 2012-11-19 DIAGNOSIS — I10 Essential (primary) hypertension: Secondary | ICD-10-CM | POA: Diagnosis present

## 2012-11-19 DIAGNOSIS — Z6841 Body Mass Index (BMI) 40.0 and over, adult: Secondary | ICD-10-CM

## 2012-11-19 DIAGNOSIS — Z7982 Long term (current) use of aspirin: Secondary | ICD-10-CM

## 2012-11-19 HISTORY — DX: Unilateral primary osteoarthritis, right knee: M17.11

## 2012-11-19 HISTORY — PX: TOTAL KNEE ARTHROPLASTY: SHX125

## 2012-11-19 LAB — URINALYSIS, ROUTINE W REFLEX MICROSCOPIC
Glucose, UA: 500 mg/dL — AB
Hgb urine dipstick: NEGATIVE
Ketones, ur: NEGATIVE mg/dL
Leukocytes, UA: NEGATIVE
pH: 5.5 (ref 5.0–8.0)

## 2012-11-19 LAB — URINE MICROSCOPIC-ADD ON

## 2012-11-19 LAB — GLUCOSE, CAPILLARY
Glucose-Capillary: 230 mg/dL — ABNORMAL HIGH (ref 70–99)
Glucose-Capillary: 157 mg/dL — ABNORMAL HIGH (ref 70–99)
Glucose-Capillary: 192 mg/dL — ABNORMAL HIGH (ref 70–99)

## 2012-11-19 SURGERY — ARTHROPLASTY, KNEE, TOTAL
Anesthesia: General | Site: Knee | Laterality: Right | Wound class: Clean

## 2012-11-19 MED ORDER — PROPOFOL 10 MG/ML IV BOLUS
INTRAVENOUS | Status: DC | PRN
  Administered 2012-11-19: 180 mg via INTRAVENOUS
  Administered 2012-11-19: 20 mg via INTRAVENOUS

## 2012-11-19 MED ORDER — METHOCARBAMOL 100 MG/ML IJ SOLN
500.0000 mg | Freq: Four times a day (QID) | INTRAVENOUS | Status: DC | PRN
  Filled 2012-11-19: qty 5

## 2012-11-19 MED ORDER — NEOSTIGMINE METHYLSULFATE 1 MG/ML IJ SOLN
INTRAMUSCULAR | Status: DC | PRN
  Administered 2012-11-19: 4 mg via INTRAVENOUS

## 2012-11-19 MED ORDER — ZOLPIDEM TARTRATE 5 MG PO TABS: 5.0000 mg | ORAL_TABLET | Freq: Every evening | ORAL | Status: DC | PRN

## 2012-11-19 MED ORDER — CELECOXIB 200 MG PO CAPS
200.0000 mg | ORAL_CAPSULE | Freq: Two times a day (BID) | ORAL | Status: DC | PRN
  Filled 2012-11-19: qty 1

## 2012-11-19 MED ORDER — DIPHENHYDRAMINE HCL 12.5 MG/5ML PO ELIX: 12.5000 mg | ORAL_SOLUTION | ORAL | Status: DC | PRN

## 2012-11-19 MED ORDER — ONDANSETRON HCL 4 MG/2ML IJ SOLN
INTRAMUSCULAR | Status: DC | PRN
  Administered 2012-11-19: 4 mg via INTRAVENOUS

## 2012-11-19 MED ORDER — DEXTROSE-NACL 5-0.45 % IV SOLN: INTRAVENOUS | Status: DC

## 2012-11-19 MED ORDER — OXYCODONE-ACETAMINOPHEN 10-325 MG PO TABS: 1.0000 | ORAL_TABLET | ORAL | Status: DC | PRN

## 2012-11-19 MED ORDER — SODIUM CHLORIDE 0.9 % IJ SOLN
INTRAMUSCULAR | Status: AC
  Filled 2012-11-19: qty 9

## 2012-11-19 MED ORDER — PHENOL 1.4 % MT LIQD: 1.0000 | OROMUCOSAL | Status: DC | PRN

## 2012-11-19 MED ORDER — ACETAMINOPHEN 325 MG PO TABS: 650.0000 mg | ORAL_TABLET | Freq: Four times a day (QID) | ORAL | Status: DC | PRN

## 2012-11-19 MED ORDER — OXYCODONE HCL 5 MG PO TABS
5.0000 mg | ORAL_TABLET | ORAL | Status: DC | PRN
  Administered 2012-11-19 – 2012-11-23 (×12): 10 mg via ORAL
  Filled 2012-11-19 (×12): qty 2

## 2012-11-19 MED ORDER — STERILE WATER FOR IRRIGATION IR SOLN
Status: DC | PRN
Start: 2012-11-19 — End: 2012-11-19
  Administered 2012-11-19: 1000 mL

## 2012-11-19 MED ORDER — ALUM & MAG HYDROXIDE-SIMETH 200-200-20 MG/5ML PO SUSP: 30.0000 mL | ORAL | Status: DC | PRN

## 2012-11-19 MED ORDER — FENTANYL CITRATE 0.05 MG/ML IJ SOLN
INTRAMUSCULAR | Status: DC | PRN
  Administered 2012-11-19: 100 ug via INTRAVENOUS
  Administered 2012-11-19: 50 ug via INTRAVENOUS
  Administered 2012-11-19: 100 ug via INTRAVENOUS

## 2012-11-19 MED ORDER — METOPROLOL TARTRATE 25 MG PO TABS
25.0000 mg | ORAL_TABLET | Freq: Every day | ORAL | Status: DC
  Administered 2012-11-20 – 2012-11-23 (×4): 25 mg via ORAL
  Filled 2012-11-19 (×4): qty 1

## 2012-11-19 MED ORDER — LIDOCAINE HCL (CARDIAC) 20 MG/ML IV SOLN
INTRAVENOUS | Status: DC | PRN
  Administered 2012-11-19: 40 mg via INTRAVENOUS

## 2012-11-19 MED ORDER — ONDANSETRON HCL 4 MG/2ML IJ SOLN: 4.0000 mg | Freq: Four times a day (QID) | INTRAMUSCULAR | Status: DC | PRN

## 2012-11-19 MED ORDER — FLEET ENEMA 7-19 GM/118ML RE ENEM: 1.0000 | ENEMA | Freq: Once | RECTAL | Status: AC | PRN

## 2012-11-19 MED ORDER — ACETAMINOPHEN 10 MG/ML IV SOLN
INTRAVENOUS | Status: AC
  Administered 2012-11-19: 1000 mg via INTRAVENOUS
  Filled 2012-11-19: qty 100

## 2012-11-19 MED ORDER — CEFUROXIME SODIUM 1.5 G IJ SOLR
INTRAMUSCULAR | Status: DC | PRN
  Administered 2012-11-19: 1.5 g

## 2012-11-19 MED ORDER — MIDAZOLAM HCL 5 MG/5ML IJ SOLN
INTRAMUSCULAR | Status: DC | PRN
  Administered 2012-11-19: 2 mg via INTRAVENOUS

## 2012-11-19 MED ORDER — BUPIVACAINE LIPOSOME 1.3 % IJ SUSP
20.0000 mL | INTRAMUSCULAR | Status: AC
  Administered 2012-11-19: 266 mg
  Filled 2012-11-19: qty 20

## 2012-11-19 MED ORDER — ONDANSETRON HCL 4 MG PO TABS: 4.0000 mg | ORAL_TABLET | Freq: Four times a day (QID) | ORAL | Status: DC | PRN

## 2012-11-19 MED ORDER — LISINOPRIL 20 MG PO TABS
20.0000 mg | ORAL_TABLET | Freq: Every day | ORAL | Status: DC
  Administered 2012-11-19 – 2012-11-20 (×2): 20 mg via ORAL
  Filled 2012-11-19 (×2): qty 1

## 2012-11-19 MED ORDER — ASPIRIN EC 325 MG PO TBEC
325.0000 mg | DELAYED_RELEASE_TABLET | Freq: Two times a day (BID) | ORAL | Status: DC
  Administered 2012-11-19 – 2012-11-23 (×8): 325 mg via ORAL
  Filled 2012-11-19 (×9): qty 1

## 2012-11-19 MED ORDER — ARTIFICIAL TEARS OP OINT
TOPICAL_OINTMENT | OPHTHALMIC | Status: DC | PRN
  Administered 2012-11-19: 1 via OPHTHALMIC

## 2012-11-19 MED ORDER — MAGNESIUM HYDROXIDE 400 MG/5ML PO SUSP: 30.0000 mL | Freq: Every day | ORAL | Status: DC | PRN

## 2012-11-19 MED ORDER — KCL IN DEXTROSE-NACL 20-5-0.45 MEQ/L-%-% IV SOLN
INTRAVENOUS | Status: DC
  Administered 2012-11-19 – 2012-11-20 (×2): via INTRAVENOUS
  Filled 2012-11-19 (×4): qty 1000

## 2012-11-19 MED ORDER — ROCURONIUM BROMIDE 100 MG/10ML IV SOLN
INTRAVENOUS | Status: DC | PRN
  Administered 2012-11-19: 50 mg via INTRAVENOUS

## 2012-11-19 MED ORDER — METOCLOPRAMIDE HCL 5 MG/ML IJ SOLN: 5.0000 mg | Freq: Three times a day (TID) | INTRAMUSCULAR | Status: DC | PRN

## 2012-11-19 MED ORDER — INSULIN DETEMIR 100 UNIT/ML ~~LOC~~ SOLN
40.0000 [IU] | Freq: Every evening | SUBCUTANEOUS | Status: DC
  Administered 2012-11-19 – 2012-11-22 (×3): 40 [IU] via SUBCUTANEOUS
  Filled 2012-11-19 (×7): qty 0.4

## 2012-11-19 MED ORDER — BISACODYL 5 MG PO TBEC: 5.0000 mg | DELAYED_RELEASE_TABLET | Freq: Every day | ORAL | Status: DC | PRN

## 2012-11-19 MED ORDER — MENTHOL 3 MG MT LOZG: 1.0000 | LOZENGE | OROMUCOSAL | Status: DC | PRN

## 2012-11-19 MED ORDER — LACTATED RINGERS IV SOLN
INTRAVENOUS | Status: DC
  Administered 2012-11-19: 12:00:00 via INTRAVENOUS

## 2012-11-19 MED ORDER — CEFUROXIME SODIUM 1.5 G IJ SOLR
INTRAMUSCULAR | Status: AC
  Filled 2012-11-19: qty 1.5

## 2012-11-19 MED ORDER — ACETAMINOPHEN 10 MG/ML IV SOLN: 1000.0000 mg | Freq: Once | INTRAVENOUS | Status: AC | PRN

## 2012-11-19 MED ORDER — METOCLOPRAMIDE HCL 10 MG PO TABS: 5.0000 mg | ORAL_TABLET | Freq: Three times a day (TID) | ORAL | Status: DC | PRN

## 2012-11-19 MED ORDER — ONDANSETRON HCL 4 MG/2ML IJ SOLN: 4.0000 mg | Freq: Once | INTRAMUSCULAR | Status: DC | PRN

## 2012-11-19 MED ORDER — SERTRALINE HCL 100 MG PO TABS
100.0000 mg | ORAL_TABLET | Freq: Every day | ORAL | Status: DC
  Administered 2012-11-20 – 2012-11-23 (×4): 100 mg via ORAL
  Filled 2012-11-19 (×4): qty 1

## 2012-11-19 MED ORDER — CHLORHEXIDINE GLUCONATE 4 % EX LIQD: 60.0000 mL | Freq: Once | CUTANEOUS | Status: DC

## 2012-11-19 MED ORDER — ALPRAZOLAM 0.5 MG PO TABS
2.0000 mg | ORAL_TABLET | Freq: Three times a day (TID) | ORAL | Status: DC | PRN
  Administered 2012-11-19 – 2012-11-22 (×3): 2 mg via ORAL
  Filled 2012-11-19 (×3): qty 4

## 2012-11-19 MED ORDER — PIOGLITAZONE HCL 45 MG PO TABS
45.0000 mg | ORAL_TABLET | Freq: Every day | ORAL | Status: DC
  Administered 2012-11-20 – 2012-11-23 (×4): 45 mg via ORAL
  Filled 2012-11-19 (×4): qty 1

## 2012-11-19 MED ORDER — HYDROMORPHONE HCL PF 1 MG/ML IJ SOLN
INTRAMUSCULAR | Status: AC
  Administered 2012-11-19: 0.5 mg via INTRAVENOUS
  Filled 2012-11-19: qty 1

## 2012-11-19 MED ORDER — GLIPIZIDE 10 MG PO TABS
10.0000 mg | ORAL_TABLET | Freq: Every day | ORAL | Status: DC
  Administered 2012-11-20 – 2012-11-23 (×4): 10 mg via ORAL
  Filled 2012-11-19 (×4): qty 1

## 2012-11-19 MED ORDER — HYDROMORPHONE HCL PF 1 MG/ML IJ SOLN
0.2500 mg | INTRAMUSCULAR | Status: DC | PRN
Start: 2012-11-19 — End: 2012-11-19
  Administered 2012-11-19: 0.5 mg via INTRAVENOUS

## 2012-11-19 MED ORDER — EXENATIDE ER 2 MG ~~LOC~~ SUSR: 2.0000 mg | SUBCUTANEOUS | Status: DC

## 2012-11-19 MED ORDER — SODIUM CHLORIDE 0.9 % IJ SOLN
INTRAMUSCULAR | Status: DC | PRN
  Administered 2012-11-19: 15:00:00

## 2012-11-19 MED ORDER — METHOCARBAMOL 500 MG PO TABS
500.0000 mg | ORAL_TABLET | Freq: Four times a day (QID) | ORAL | Status: DC | PRN
  Administered 2012-11-19 – 2012-11-23 (×8): 500 mg via ORAL
  Filled 2012-11-19 (×8): qty 1

## 2012-11-19 MED ORDER — GLYCOPYRROLATE 0.2 MG/ML IJ SOLN
INTRAMUSCULAR | Status: DC | PRN
  Administered 2012-11-19: 0.6 mg via INTRAVENOUS

## 2012-11-19 MED ORDER — ACETAMINOPHEN 650 MG RE SUPP: 650.0000 mg | Freq: Four times a day (QID) | RECTAL | Status: DC | PRN

## 2012-11-19 MED ORDER — OXYCODONE-ACETAMINOPHEN 5-325 MG PO TABS
1.0000 | ORAL_TABLET | ORAL | Status: DC | PRN
  Administered 2012-11-19 – 2012-11-21 (×2): 2 via ORAL
  Filled 2012-11-19 (×2): qty 2

## 2012-11-19 MED ORDER — LACTATED RINGERS IV SOLN
INTRAVENOUS | Status: DC | PRN
  Administered 2012-11-19 (×3): via INTRAVENOUS

## 2012-11-19 MED ORDER — ATORVASTATIN CALCIUM 20 MG PO TABS
20.0000 mg | ORAL_TABLET | Freq: Every evening | ORAL | Status: DC
  Administered 2012-11-19 – 2012-11-22 (×4): 20 mg via ORAL
  Filled 2012-11-19 (×5): qty 1

## 2012-11-19 MED ORDER — INSULIN ASPART 100 UNIT/ML ~~LOC~~ SOLN
0.0000 [IU] | Freq: Three times a day (TID) | SUBCUTANEOUS | Status: DC
  Administered 2012-11-20 (×2): 3 [IU] via SUBCUTANEOUS
  Administered 2012-11-20: 5 [IU] via SUBCUTANEOUS
  Administered 2012-11-21: 8 [IU] via SUBCUTANEOUS
  Administered 2012-11-21: 5 [IU] via SUBCUTANEOUS
  Administered 2012-11-21: 3 [IU] via SUBCUTANEOUS
  Administered 2012-11-22: 5 [IU] via SUBCUTANEOUS
  Administered 2012-11-22: 3 [IU] via SUBCUTANEOUS
  Administered 2012-11-22 – 2012-11-23 (×2): 2 [IU] via SUBCUTANEOUS

## 2012-11-19 MED ORDER — METFORMIN HCL 500 MG PO TABS
1000.0000 mg | ORAL_TABLET | Freq: Two times a day (BID) | ORAL | Status: DC
  Administered 2012-11-20 – 2012-11-23 (×7): 1000 mg via ORAL
  Filled 2012-11-19 (×9): qty 2

## 2012-11-19 MED ORDER — HYDROMORPHONE HCL PF 1 MG/ML IJ SOLN
1.0000 mg | INTRAMUSCULAR | Status: DC | PRN
  Administered 2012-11-20 – 2012-11-21 (×5): 1 mg via INTRAVENOUS
  Filled 2012-11-19 (×5): qty 1

## 2012-11-19 MED ORDER — SODIUM CHLORIDE 0.9 % IR SOLN
Status: DC | PRN
  Administered 2012-11-19: 3000 mL

## 2012-11-19 SURGICAL SUPPLY — 60 items
BANDAGE ELASTIC 6 VELCRO ST LF (GAUZE/BANDAGES/DRESSINGS) ×2 IMPLANT
BANDAGE ESMARK 6X9 LF (GAUZE/BANDAGES/DRESSINGS) ×1 IMPLANT
BLADE SAG 18X100X1.27 (BLADE) ×2 IMPLANT
BLADE SAW SGTL 13X75X1.27 (BLADE) ×2 IMPLANT
BLADE SURG ROTATE 9660 (MISCELLANEOUS) IMPLANT
BNDG CMPR 9X6 STRL LF SNTH (GAUZE/BANDAGES/DRESSINGS) ×1
BNDG CMPR MED 10X6 ELC LF (GAUZE/BANDAGES/DRESSINGS) ×1
BNDG ELASTIC 6X10 VLCR STRL LF (GAUZE/BANDAGES/DRESSINGS) ×2 IMPLANT
BNDG ESMARK 6X9 LF (GAUZE/BANDAGES/DRESSINGS) ×2
BOWL SMART MIX CTS (DISPOSABLE) ×2 IMPLANT
CAPT RP KNEE ×2 IMPLANT
CEMENT HV SMART SET (Cement) ×4 IMPLANT
CLOTH BEACON ORANGE TIMEOUT ST (SAFETY) ×2 IMPLANT
COVER SURGICAL LIGHT HANDLE (MISCELLANEOUS) ×2 IMPLANT
CUFF TOURNIQUET SINGLE 34IN LL (TOURNIQUET CUFF) ×1 IMPLANT
CUFF TOURNIQUET SINGLE 44IN (TOURNIQUET CUFF) IMPLANT
DRAPE EXTREMITY T 121X128X90 (DRAPE) ×2 IMPLANT
DRAPE U-SHAPE 47X51 STRL (DRAPES) ×2 IMPLANT
DURAPREP 26ML APPLICATOR (WOUND CARE) ×2 IMPLANT
ELECT REM PT RETURN 9FT ADLT (ELECTROSURGICAL) ×2
ELECTRODE REM PT RTRN 9FT ADLT (ELECTROSURGICAL) ×1 IMPLANT
EVACUATOR 1/8 PVC DRAIN (DRAIN) ×2 IMPLANT
GAUZE XEROFORM 1X8 LF (GAUZE/BANDAGES/DRESSINGS) ×2 IMPLANT
GAUZE XEROFORM 5X9 LF (GAUZE/BANDAGES/DRESSINGS) ×2 IMPLANT
GLOVE BIO SURGEON STRL SZ7 (GLOVE) ×4 IMPLANT
GLOVE BIO SURGEON STRL SZ7.5 (GLOVE) ×4 IMPLANT
GLOVE BIO SURGEON STRL SZ8 (GLOVE) ×2 IMPLANT
GLOVE BIOGEL PI IND STRL 7.0 (GLOVE) ×5 IMPLANT
GLOVE BIOGEL PI IND STRL 8 (GLOVE) ×3 IMPLANT
GLOVE BIOGEL PI INDICATOR 7.0 (GLOVE) ×5
GLOVE BIOGEL PI INDICATOR 8 (GLOVE) ×3
GOWN PREVENTION PLUS XLARGE (GOWN DISPOSABLE) ×4 IMPLANT
GOWN STRL NON-REIN LRG LVL3 (GOWN DISPOSABLE) ×6 IMPLANT
HANDPIECE INTERPULSE COAX TIP (DISPOSABLE) ×2
HOOD PEEL AWAY FACE SHEILD DIS (HOOD) ×6 IMPLANT
KIT BASIN OR (CUSTOM PROCEDURE TRAY) ×2 IMPLANT
KIT ROOM TURNOVER OR (KITS) ×2 IMPLANT
MANIFOLD NEPTUNE II (INSTRUMENTS) ×2 IMPLANT
NS IRRIG 1000ML POUR BTL (IV SOLUTION) ×2 IMPLANT
PACK TOTAL JOINT (CUSTOM PROCEDURE TRAY) ×2 IMPLANT
PAD ARMBOARD 7.5X6 YLW CONV (MISCELLANEOUS) ×4 IMPLANT
PAD CAST 4YDX4 CTTN HI CHSV (CAST SUPPLIES) ×1 IMPLANT
PADDING CAST COTTON 4X4 STRL (CAST SUPPLIES) ×2
PADDING CAST COTTON 6X4 STRL (CAST SUPPLIES) ×2 IMPLANT
SET HNDPC FAN SPRY TIP SCT (DISPOSABLE) ×1 IMPLANT
SPONGE GAUZE 4X4 12PLY (GAUZE/BANDAGES/DRESSINGS) ×2 IMPLANT
STAPLER VISISTAT 35W (STAPLE) ×2 IMPLANT
SUCTION FRAZIER TIP 10 FR DISP (SUCTIONS) ×2 IMPLANT
SUT VIC AB 0 CTX 36 (SUTURE) ×2
SUT VIC AB 0 CTX36XBRD ANTBCTR (SUTURE) ×1 IMPLANT
SUT VIC AB 1 CTX 36 (SUTURE) ×2
SUT VIC AB 1 CTX36XBRD ANBCTR (SUTURE) ×1 IMPLANT
SUT VIC AB 2-0 CT1 27 (SUTURE) ×2
SUT VIC AB 2-0 CT1 TAPERPNT 27 (SUTURE) ×1 IMPLANT
SYR 50ML LL SCALE MARK (SYRINGE) ×2 IMPLANT
TOWEL OR 17X24 6PK STRL BLUE (TOWEL DISPOSABLE) ×2 IMPLANT
TOWEL OR 17X26 10 PK STRL BLUE (TOWEL DISPOSABLE) ×2 IMPLANT
TRAY FOLEY CATH 14FR (SET/KITS/TRAYS/PACK) IMPLANT
TRAY FOLEY CATH 16FR SILVER (SET/KITS/TRAYS/PACK) ×2 IMPLANT
WATER STERILE IRR 1000ML POUR (IV SOLUTION) ×4 IMPLANT

## 2012-11-19 NOTE — Progress Notes (Signed)
Patient admitted to room 5N-20 via stretcher from PACU s/p Right Total Knee Arthroplasty.  Pt. Oriented to unit and surroundings.  On CPM machine at this time.  Pt. Is alert and oriented x 3, but lethargic.  Appears very anxious and wants his pain medicine "with vodka".  Pt. Is cooperative at this time.  Brother in attendance.  Dressing on right knee is intact.  Hemovac is charged and draining mod. Amt. Of sanguinous drainage.

## 2012-11-19 NOTE — Op Note (Signed)
PATIENT ID:      Corey Romero  MRN:     [id number] DOB/AGE:    [birth year] / 63 y.o.       OPERATIVE REPORT    DATE OF PROCEDURE:  11/19/2012       PREOPERATIVE DIAGNOSIS:   RIGHT KNEE OSTEOARTHRITIS      Estimated body mass index is 41.52 kg/(m^2) as calculated from the following:   Height as of 11/10/12: 6' (1.829 m).   Weight as of 06/30/12: 138.9 kg (306 lb 3.5 oz).                                                        POSTOPERATIVE DIAGNOSIS:   RIGHT KNEE OSTEOARTHRITIS                                                                      PROCEDURE:  Procedure(s): TOTAL KNEE ARTHROPLASTY Using Depuy Sigma RP implants #5R Femur, #5Tibia, 10mm sigma RP bearing, 41 Patella     SURGEON: Mischelle Reeg J    ASSISTANT:   Shirl Harris PA-C   (Present and scrubbed throughout the case, critical for assistance with exposure, retraction, instrumentation, and closure.)         ANESTHESIA: GET Eparel  DRAINS: foley, 2 medium hemovac in knee   TOURNIQUET TIME:   COMPLICATIONS:  None     SPECIMENS: None   INDICATIONS FOR PROCEDURE: The patient has  RIGHT KNEE OSTEOARTHRITIS, varus deformities, XR shows bone on bone arthritis. Patient has failed all conservative measures including anti-inflammatory medicines, narcotics, attempts at  exercise and weight loss, cortisone injections and viscosupplementation.  Risks and benefits of surgery have been discussed, questions answered.   DESCRIPTION OF PROCEDURE: The patient identified by armband, received  IV antibiotics, in the holding area at [facility]. Patient taken to the operating room, appropriate anesthetic  monitors were attached, and general endotracheal anesthesia induced with  the patient in supine position, Foley catheter was inserted. Tourniquet  applied high to the operative thigh. Lateral post and foot positioner  applied to the table, the lower extremity was then prepped and draped  in usual sterile fashion from the  ankle to the tourniquet. Time-out procedure was performed. The limb was wrapped with an Esmarch bandage and the tourniquet inflated to 350 mmHg. We began the operation by making the anterior midline incision starting at handbreadth above the patella going over the patella 1 cm medial to and  4 cm distal to the tibial tubercle. Small bleeders in the skin and the  subcutaneous tissue identified and cauterized. Transverse retinaculum was incised and reflected medially and a medial parapatellar arthrotomy was accomplished. the patella was everted and theprepatellar fat pad resected. The superficial medial collateral  ligament was then elevated from anterior to posterior along the proximal  flare of the tibia and anterior half of the menisci resected. The knee was hyperflexed exposing bone on bone arthritis. Peripheral and notch osteophytes as well as the cruciate ligaments were then resected. We continued to  work our way around posteriorly along the  proximal tibia, and externally  rotated the tibia subluxing it out from underneath the femur. A McHale  retractor was placed through the notch and a lateral Hohmann retractor  placed, and we then drilled through the proximal tibia in line with the  axis of the tibia followed by an intramedullary guide rod and 2-degree  posterior slope cutting guide. The tibial cutting guide was pinned into place  allowing resection of 4 mm of bone medially and about 8 mm of bone  laterally because of her varus deformity. Satisfied with the tibial resection, we then  entered the distal femur 2 mm anterior to the PCL origin with the  intramedullary guide rod and applied the distal femoral cutting guide  set at 11mm, with 5 degrees of valgus. This was pinned along the  epicondylar axis. At this point, the distal femoral cut was accomplished without difficulty. We then sized for a #5R femoral component and pinned the guide in 3 degrees of external rotation.The chamfer cutting  guide was pinned into place. The anterior, posterior, and chamfer cuts were accomplished without difficulty followed by  the Sigma RP box cutting guide and the box cut. We also removed posterior osteophytes from the posterior femoral condyles. At this  time, the knee was brought into full extension. We checked our  extension and flexion gaps and found them symmetric at 10mm.  The patella thickness measured at 25 mm. We set the cutting guide at 15 and removed the posterior 10 mm  of the patella, sized for a 41 button and drilled the lollipop. The knee  was then once again hyperflexed exposing the proximal tibia. We sized for a #5 tibial base plate, applied the smokestack and the conical reamer followed by the the Delta fin keel punch. We then hammered into place the Sigma RP trial femoral component, inserted a 10-mm trial bearing, trial patellar button, and took the knee through range of motion from 0-130 degrees. No thumb pressure was required for patellar  tracking. At this point, all trial components were removed, a double batch of DePuy HV cement with 1500 mg of Zinacef was mixed and applied to all bony metallic mating surfaces except for the posterior condyles of the femur itself. In order, we  hammered into place the tibial tray and removed excess cement, the femoral component and removed excess cement, a 10-mm Sigma RP bearing  was inserted, and the knee brought to full extension with compression.  The patellar button was clamped into place, and excess cement  removed. While the cement cured the wound was irrigated out with normal saline solution pulse lavage, and medium Hemovac drains were placed from an anterolateral  approach. Ligament stability and patellar tracking were checked and found to be excellent. The parapatellar arthrotomy was closed with  running #1 Vicryl suture. The subcutaneous tissue with 0 and 2-0 undyed  Vicryl suture, and the skin with skin staples. A dressing of Xeroform,   4 x 4, dressing sponges, Webril, and Ace wrap applied. The patient  awakened, extubated, and taken to recovery room without difficulty.   Siegfried Vieth J 11/19/2012, 3:55 PM

## 2012-11-19 NOTE — Transfer of Care (Signed)
Immediate Anesthesia Transfer of Care Note  Patient: Corey Romero  Procedure(s) Performed: Procedure(s): TOTAL KNEE ARTHROPLASTY (Right)  Patient Location: PACU  Anesthesia Type:General  Level of Consciousness: awake, alert , oriented and sedated  Airway & Oxygen Therapy: Patient Spontanous Breathing and Patient connected to nasal cannula oxygen  Post-op Assessment: Report given to PACU RN, Post -op Vital signs reviewed and stable and Patient moving all extremities  Post vital signs: Reviewed and stable  Complications: No apparent anesthesia complications

## 2012-11-19 NOTE — Anesthesia Postprocedure Evaluation (Signed)
  Anesthesia Post-op Note  Patient: Corey Romero  Procedure(s) Performed: Procedure(s): TOTAL KNEE ARTHROPLASTY (Right)  Patient Location: PACU  Anesthesia Type:General  Level of Consciousness: awake, alert , oriented and patient cooperative  Airway and Oxygen Therapy: Patient Spontanous Breathing  Post-op Pain: mild  Post-op Assessment: Post-op Vital signs reviewed, Patient's Cardiovascular Status Stable, Respiratory Function Stable, Patent Airway, No signs of Nausea or vomiting and Pain level controlled  Post-op Vital Signs: stable  Complications: No apparent anesthesia complications

## 2012-11-19 NOTE — Anesthesia Preprocedure Evaluation (Addendum)
Anesthesia Evaluation  Patient identified by MRN, date of birth, ID band Patient awake    Reviewed: Allergy & Precautions, H&P , NPO status , Patient's Chart, lab work & pertinent test results  Airway Mallampati: III TM Distance: >3 FB Neck ROM: Full    Dental  (+) Teeth Intact, Caps and Dental Advisory Given   Pulmonary          Cardiovascular + CAD and + Cardiac Stents (1991)     Neuro/Psych    GI/Hepatic   Endo/Other  diabetes, Type 2, Oral Hypoglycemic Agents and Insulin Dependent  Renal/GU      Musculoskeletal   Abdominal   Peds  Hematology   Anesthesia Other Findings   Reproductive/Obstetrics                           Anesthesia Physical Anesthesia Plan  ASA: III  Anesthesia Plan: General ETT   Post-op Pain Management:    Induction:   Airway Management Planned:   Additional Equipment:   Intra-op Plan:   Post-operative Plan:   Informed Consent: I have reviewed the patients History and Physical, chart, labs and discussed the procedure including the risks, benefits and alternatives for the proposed anesthesia with the patient or authorized representative who has indicated his/her understanding and acceptance.   Dental Advisory Given and History available from chart only  Plan Discussed with:   Anesthesia Plan Comments:         Anesthesia Quick Evaluation

## 2012-11-19 NOTE — Preoperative (Signed)
Beta Blockers   Reason not to administer Beta Blockers:Not Applicable 

## 2012-11-19 NOTE — Progress Notes (Signed)
Orthopedic Tech Progress Note Patient Details:  Corey Romero [birth year] [id number]  CPM Right Knee CPM Right Knee: On Right Knee Flexion (Degrees): 60 Right Knee Extension (Degrees): 0 Additional Comments: Trapeze bar   Cammer, Mickie Bail 11/19/2012, 5:19 PM

## 2012-11-19 NOTE — Anesthesia Procedure Notes (Signed)
Procedure Name: Intubation Date/Time: 11/19/2012 2:20 PM Performed by: Fransisca Kaufmann Pre-anesthesia Checklist: Patient identified, Emergency Drugs available, Suction available, Patient being monitored and Timeout performed Patient Re-evaluated:Patient Re-evaluated prior to inductionOxygen Delivery Method: Circle system utilized Preoxygenation: Pre-oxygenation with 100% oxygen Intubation Type: IV induction Ventilation: Oral airway inserted - appropriate to patient size and Mask ventilation without difficulty Laryngoscope Size: Miller and 2 Grade View: Grade III Tube type: Oral Tube size: 7.5 mm Number of attempts: 1 Placement Confirmation: ETT inserted through vocal cords under direct vision,  breath sounds checked- equal and bilateral and positive ETCO2 Secured at: 21 cm Tube secured with: Tape Dental Injury: Teeth and Oropharynx as per pre-operative assessment

## 2012-11-19 NOTE — Interval H&P Note (Signed)
History and Physical Interval Note:  11/19/2012 12:40 PM  Corey Romero  has presented today for surgery, with the diagnosis of RIGHT KNEE OSTEOARTHRITIS  The various methods of treatment have been discussed with the patient and family. After consideration of risks, benefits and other options for treatment, the patient has consented to  Procedure(s): TOTAL KNEE ARTHROPLASTY (Right) as a surgical intervention .  The patient's history has been reviewed, patient examined, no change in status, stable for surgery.  I have reviewed the patient's chart and labs.  Questions were answered to the patient's satisfaction.     Nestor Lewandowsky

## 2012-11-20 ENCOUNTER — Encounter (HOSPITAL_COMMUNITY): Payer: Self-pay | Admitting: Emergency Medicine

## 2012-11-20 LAB — CBC
MCH: 29.7 pg (ref 26.0–34.0)
MCV: 89.1 fL (ref 78.0–100.0)
Platelets: 206 10*3/uL (ref 150–400)
RBC: 3.67 MIL/uL — ABNORMAL LOW (ref 4.22–5.81)

## 2012-11-20 LAB — BASIC METABOLIC PANEL
BUN: 17 mg/dL (ref 6–23)
CO2: 20 mEq/L (ref 19–32)
Calcium: 9.1 mg/dL (ref 8.4–10.5)
Creatinine, Ser: 0.8 mg/dL (ref 0.50–1.35)
Glucose, Bld: 207 mg/dL — ABNORMAL HIGH (ref 70–99)

## 2012-11-20 MED ORDER — DEXTROSE-NACL 5-0.45 % IV SOLN
INTRAVENOUS | Status: DC
  Administered 2012-11-20 – 2012-11-21 (×3): via INTRAVENOUS

## 2012-11-20 NOTE — Progress Notes (Signed)
PT Cancellation Note  Patient Details Name: Corey Romero MRN: [id number] DOB: 1950-05-28   Cancelled Treatment:    Reason Eval/Treat Not Completed: Pain limiting ability to participate   [name] 11/20/2012, 4:17 PM Weldon Picking PT Acute Rehab Services [phone] Beeper [phone]

## 2012-11-20 NOTE — Evaluation (Signed)
Physical Therapy Evaluation Patient Details Name: Corey Romero MRN: [id number] DOB: 09-05-1950 Today's Date: 11/20/2012 Time: 4098-1191 PT Time Calculation (min): 30 min  PT Assessment / Plan / Recommendation History of Present Illness  Pt. underwent R TKA due to  endstage OA.  He had L  TKA 07/11/12.  Clinical Impression  Pt .presents to PT with an expected decrease in his functional mobility , gait, ROM and strength following TKA.  He needs acute PT to address these areas.  He does have dyspnea with exertion of RW use (2/4) but expect this is his baseline due to cardiac history.  Should be able to progress well enough for DC home with 24 hour assist.    PT Assessment  Patient needs continued PT services    Follow Up Recommendations  Home health PT;Supervision/Assistance - 24 hour    Does the patient have the potential to tolerate intense rehabilitation      Barriers to Discharge        Equipment Recommendations  None recommended by PT    Recommendations for Other Services     Frequency 7X/week    Precautions / Restrictions Precautions Precautions: Knee Precaution Booklet Issued: Yes (comment) Precaution Comments: pt. was issued a TKA sheet and insturcted in AP and quad sets Restrictions Weight Bearing Restrictions: Yes RLE Weight Bearing: Weight bearing as tolerated   Pertinent Vitals/Pain See vitals tab;  Dyspnea 2/4 with activity       Mobility  Bed Mobility Bed Mobility: Supine to Sit;Sitting - Scoot to Edge of Bed;Sit to Supine Supine to Sit: 1: +2 Total assist Supine to Sit: Patient Percentage: 60% Sitting - Scoot to Edge of Bed: 3: Mod assist Sit to Supine: Not Tested (comment) Details for Bed Mobility Assistance: cues for technique and assist to upright his trunk; pt. needed full assist to manage R LE Transfers Transfers: Sit to Stand;Stand to Sit Sit to Stand: 1: +2 Total assist;From bed;With upper extremity assist;From elevated surface (bed height  raised) Sit to Stand: Patient Percentage: 70% Stand to Sit: 1: +2 Total assist;To chair/3-in-1;With armrests Stand to Sit: Patient Percentage: 70% Details for Transfer Assistance: cues for hand placement and technique, min assist to rrise to stand and to control descent Ambulation/Gait Ambulation/Gait Assistance: 1: +2 Total assist Ambulation/Gait: Patient Percentage: 80% Ambulation Distance (Feet): 7 Feet Assistive device: Rolling walker Ambulation/Gait Assistance Details: cues for sequence and assist to initiate step with right LE.  After 2 steps, pt. able to step through on his own.  Pt. with 2/4 dyspnea with exertion of using RW Gait Pattern: Step-to pattern;Decreased weight shift to right;Decreased stance time - right    Exercises Total Joint Exercises Ankle Circles/Pumps: AROM;Both;10 reps Quad Sets: AROM;Right;10 reps   PT Diagnosis: Difficulty walking;Acute pain;Abnormality of gait  PT Problem List: Decreased range of motion;Decreased activity tolerance;Decreased mobility;Decreased knowledge of use of DME;Pain;Cardiopulmonary status limiting activity PT Treatment Interventions: DME instruction;Gait training;Stair training;Functional mobility training;Therapeutic activities;Therapeutic exercise;Patient/family education     PT Goals(Current goals can be found in the care plan section) Acute Rehab PT Goals Patient Stated Goal: wants to return home for rehab PT Goal Formulation: With patient Time For Goal Achievement: 11/27/12 Potential to Achieve Goals: Good  Visit Information  Last PT Received On: 11/20/12 Assistance Needed: +2 History of Present Illness: Pt. underwent R TKA f endstage OA.  He had L  TKA 07/11/12.       Prior Functioning  Home Living Family/patient expects to be discharged to:: Private residence Living  Arrangements: Alone Available Help at Discharge: Family;Friend(s);Available 24 hours/day Type of Home: House Home Access: Stairs to enter;Ramped  entrance Entrance Stairs-Number of Steps: 3 Entrance Stairs-Rails: Right;Left Home Layout: One level Home Equipment: Tub bench;Walker - 2 wheels;Hand held shower head;Bedside commode Prior Function Level of Independence: Independent Communication Communication: No difficulties    Cognition  Cognition Arousal/Alertness: Awake/alert    Extremity/Trunk Assessment Upper Extremity Assessment Upper Extremity Assessment: Overall WFL for tasks assessed Lower Extremity Assessment Lower Extremity Assessment: RLE deficits/detail (L LE WFL for tasks) RLE Deficits / Details: fair quad set ; good ankle pump   Balance    End of Session PT - End of Session Equipment Utilized During Treatment: Gait belt Activity Tolerance: Patient tolerated treatment well;Patient limited by pain Patient left: in chair;with call bell/phone within reach;with family/visitor present Nurse Communication: Mobility status CPM Right Knee CPM Right Knee: Off  GP     [name] 11/20/2012, 2:47 PM Weldon Picking PT Acute Rehab Services [phone] Bee[facility] [phone]

## 2012-11-20 NOTE — Progress Notes (Signed)
Patient ID: Corey Romero, male   DOB: 1950-06-29, 62 y.o.   MRN: [id number] PATIENT ID: Corey Romero  MRN: [id number]  DOB/AGE:  02/15/1951 / 62 y.o.  1 Day Post-Op Procedure(s) (LRB): TOTAL KNEE ARTHROPLASTY (Right)    PROGRESS NOTE Subjective: Patient is alert, oriented, no Nausea, no Vomiting, yes passing gas, no Bowel Movement. Taking PO sips. Denies SOB, Chest or Calf Pain. Using Incentive Spirometer, PAS in place. Ambulate WBAT, CPM 0-60. Patient reports pain as moderate  .    Objective: Vital signs in last 24 hours: Filed Vitals:   11/19/12 1829 11/19/12 2106 11/20/12 0227 11/20/12 0534  BP: 130/67 114/51 110/70 126/62  Pulse: 65 70 76 73  Temp: 97.4 F (36.3 C) 98.1 F (36.7 C) 98.7 F (37.1 C) 98.9 F (37.2 C)  TempSrc:  Oral Oral Oral  Resp: 16 18 16 16   Height: 6' (1.829 m)     Weight: 136.2 kg (300 lb 4.3 oz)     SpO2: 92% 100% 100% 99%      Intake/Output from previous day: I/O last 3 completed shifts: In: 1120 [P.O.:120; I.V.:1000] Out: 1560 [Urine:1100; Drains:460]   Intake/Output this shift:     LABORATORY DATA:  Recent Labs  11/19/12 1630 11/19/12 2037 11/20/12 0615 11/20/12 0639  WBC  --   --  10.0  --   HGB  --   --  10.9*  --   HCT  --   --  32.7*  --   PLT  --   --  206  --   NA  --   --  130*  --   K  --   --  5.9*  --   CL  --   --  96  --   CO2  --   --  20  --   BUN  --   --  17  --   CREATININE  --   --  0.80  --   GLUCOSE  --   --  207*  --   GLUCAP 128* 192*  --  198*  CALCIUM  --   --  9.1  --     Examination: Neurologically intact ABD soft Neurovascular intact Sensation intact distally Intact pulses distally Dorsiflexion/Plantar flexion intact Incision: no drainage No cellulitis present Compartment soft}  Assessment:   1 Day Post-Op Procedure(s) (LRB): TOTAL KNEE ARTHROPLASTY (Right) ADDITIONAL DIAGNOSIS:  Diabetes and Hypertension  Plan: PT/OT WBAT, CPM 5/hrs day until ROM 0-90 degrees, then D/C CPM DVT  Prophylaxis:  SCDx72hrs, ASA 325 mg BID x 2 weeks DISCHARGE PLAN: Home DISCHARGE NEEDS: HHPT, HHRN, CPM, Walker and 3-in-1 comode seat     Ardis Fullwood J 11/20/2012, 9:11 AM

## 2012-11-20 NOTE — Progress Notes (Signed)
Patient refused to have his foley removed this morning. He would like to wait until later this morning. Will pass information to on-coming staff.

## 2012-11-21 LAB — GLUCOSE, CAPILLARY
Glucose-Capillary: 207 mg/dL — ABNORMAL HIGH (ref 70–99)
Glucose-Capillary: 118 mg/dL — ABNORMAL HIGH (ref 70–99)

## 2012-11-21 LAB — CBC
HCT: 29.6 % — ABNORMAL LOW (ref 39.0–52.0)
MCH: 29.9 pg (ref 26.0–34.0)
MCV: 88.6 fL (ref 78.0–100.0)
RBC: 3.34 MIL/uL — ABNORMAL LOW (ref 4.22–5.81)
WBC: 10.4 10*3/uL (ref 4.0–10.5)

## 2012-11-21 MED ORDER — HYDROMORPHONE HCL 4 MG PO TABS: 4.0000 mg | ORAL_TABLET | ORAL | Status: DC | PRN

## 2012-11-21 MED ORDER — HYDROMORPHONE HCL 2 MG PO TABS
4.0000 mg | ORAL_TABLET | ORAL | Status: DC | PRN
  Administered 2012-11-21: 4 mg via ORAL
  Administered 2012-11-22: 6 mg via ORAL
  Filled 2012-11-21: qty 2
  Filled 2012-11-21: qty 3

## 2012-11-21 NOTE — Care Management Note (Unsigned)
    Page 1 of 1   11/21/2012     11:11:35 AM   CARE MANAGEMENT NOTE 11/21/2012  Patient:  Corey Romero, Corey Romero   Account Number:  [id number]  Date Initiated:  11/20/2012  Documentation initiated by:  [facility]  Subjective/Objective Assessment:   admitted postop rt total knee arthroplasty     Action/Plan:   Plan return home with [facility]   Anticipated DC Date:  11/22/2012   Anticipated DC Plan:  HOME W HOME HEALTH SERVICES      DC Planning Services  CM consult      Choice offered to / List presented to:          [facility] arranged  HH-1 RN  HH-2 PT  HH-3 OT      [facility] agency  [facility]   Status of service:  In process, will continue to follow Medicare Important Message given?   (If response is "NO", the following Medicare IM given date fields will be blank) Date Medicare IM given:   Date Additional Medicare IM given:    Discharge Disposition:    Per UR Regulation:    If discussed at Long Length of Stay Meetings, dates discussed:    Comments:  11/21/12 Patient set up with HHPT, HHOT and HHRN with [facility] by [place] office. Spoke with patient and he plans to d/c to home with HHC. Spoke with PT and due to patient's lack of progress they may recommend SNF. Notified CSW of possible change in d/c plan depending on patient's progress. Will continue to follow for d/c needs. Jacquelynn Cree RN, BSN, CCM

## 2012-11-21 NOTE — Evaluation (Signed)
Occupational Therapy Evaluation Patient Details Name: Corey Romero MRN: [id number] DOB: 1950/10/21 Today's Date: 11/21/2012 Time: 5638-7564 OT Time Calculation (min): 23 min  OT Assessment / Plan / Recommendation History of present illness Pt. underwent R TKA f endstage OA.  He had L  TKA 07/11/12.  so far, pt. has slowly and he says he is in much greater pain than prior TKA in [place].   Clinical Impression   Pt currently needing total assist for toileting and LB selfcare tasks.  Pt with increased pain in the right knee compared to previous surgery and is currently not able to tolerate weightbearing over the right knee.  Feel pt will benefit from acute care OT to help increase overall independence but feel based on pt's current levels pt will likely need SNF for rehab before home.    OT Assessment  Patient needs continued OT Services    Follow Up Recommendations  SNF       Equipment Recommendations  None recommended by OT       Frequency  Min 2X/week    Precautions / Restrictions Precautions Precautions: Knee Restrictions Weight Bearing Restrictions: No RLE Weight Bearing: Weight bearing as tolerated   Pertinent Vitals/Pain Pt with pain 8/10 in the right knee, pt repositioned in bed    ADL  Eating/Feeding: Simulated;Independent Where Assessed - Eating/Feeding: Chair Grooming: Simulated;Supervision/safety Where Assessed - Grooming: Supported sitting Upper Body Bathing: Simulated;Supervision/safety Where Assessed - Upper Body Bathing: Unsupported sitting Lower Body Bathing: Simulated;+1 Total assistance Where Assessed - Lower Body Bathing: Supported sit to stand Upper Body Dressing: Simulated;Set up Where Assessed - Upper Body Dressing: Supported sitting Lower Body Dressing: Simulated;+1 Total assistance Where Assessed - Lower Body Dressing: Supported sit to stand Toilet Transfer: Simulated;+1 Total assistance Toilet Transfer Method: Retail banker: Other (comment) (simulated to bedside chair, pt used urinal instead) Toileting - Clothing Manipulation and Hygiene: +1 Total assistance;Simulated Where Assessed - Glass blower/designer Manipulation and Hygiene: Sit to stand from 3-in-1 or toilet Tub/Shower Transfer Method: Not assessed Equipment Used: Gait belt;Rolling walker Transfers/Ambulation Related to ADLs: Pt currently only able to take 1-2 small steps with total assist to pivot back to the bed from the bedside chair.  Pt with decreased ability to tolerate weightbearing over the RLE to adequately step with the LLE.   ADL Comments: Pt more limited with mobility and ADL function than previous knee surgery.  Currently needs total assist for LB selfcare and toilet transfers.  Has some help at home but feel he will likely need SNF for follow-up based on current assist level.      OT Diagnosis: Generalized weakness;Acute pain  OT Problem List: Decreased strength;Decreased activity tolerance;Impaired balance (sitting and/or standing);Decreased knowledge of use of DME or AE;Pain OT Treatment Interventions: Self-care/ADL training;Patient/family education;Therapeutic activities;Balance training;DME and/or AE instruction   OT Goals(Current goals can be found in the care plan section) Acute Rehab OT Goals Patient Stated Goal: wants to return home for rehab OT Goal Formulation: With patient Time For Goal Achievement: 11/28/12 Potential to Achieve Goals: Good ADL Goals Pt Will Perform Grooming: with supervision;with min assist;standing Pt Will Perform Lower Body Bathing: with min assist;sit to/from stand;with adaptive equipment Pt Will Perform Lower Body Dressing: with min assist;sit to/from stand;with adaptive equipment Pt Will Transfer to Toilet: ambulating;bedside commode;with min assist Additional ADL Goal #1: Pt will transfer from supine to sit EOB with min assist in preparation for selfcare tasks.  Visit Information  Last OT Received  On:  11/21/12 Assistance Needed: +1 History of Present Illness: Pt. underwent R TKA f endstage OA.  He had L  TKA 07/11/12.  so far, pt. has slowly and he says he is in much greater pain than prior TKA in [place].       Prior Functioning     Home Living Family/patient expects to be discharged to:: Private residence Living Arrangements: Alone Available Help at Discharge: Family;Friend(s);Available 24 hours/day Type of Home: House Home Access: Stairs to enter;Ramped entrance Entrance Stairs-Number of Steps: 3 Entrance Stairs-Rails: Right;Left Home Layout: One level Home Equipment: Tub bench;Walker - 2 wheels;Hand held shower head;Bedside commode Prior Function Level of Independence: Independent Communication Communication: No difficulties Dominant Hand: Right         Vision/Perception Vision - History Baseline Vision: No visual deficits Patient Visual Report: No change from baseline Vision - Assessment Eye Alignment: Within Functional Limits Vision Assessment: Vision not tested Perception Perception: Within Functional Limits Praxis Praxis: Intact   Cognition  Cognition Arousal/Alertness: Awake/alert Behavior During Therapy: WFL for tasks assessed/performed Overall Cognitive Status: Within Functional Limits for tasks assessed    Extremity/Trunk Assessment Upper Extremity Assessment Upper Extremity Assessment: Overall WFL for tasks assessed Cervical / Trunk Assessment Cervical / Trunk Assessment: Normal     Mobility Bed Mobility Bed Mobility: Sit to Supine Supine to Sit: 1: +2 Total assist Supine to Sit: Patient Percentage: 60% Sitting - Scoot to Edge of Bed: 4: Min assist Sit to Supine: 4: Min assist Details for Bed Mobility Assistance: Pt needed assist with lifting the RLE up in to the bed.   Transfers Transfers: Sit to Stand Sit to Stand: 1: +1 Total assist;From chair/3-in-1 Sit to Stand: Patient Percentage: 60% Stand to Sit: 1: +1 Total assist;To  bed Stand to Sit: Patient Percentage: 70% Details for Transfer Assistance: Pt needed mod instructional cueing for hand placement and to attempt to straighten out the RLE as sitting.          Balance Balance Balance Assessed: Yes Static Standing Balance Static Standing - Balance Support: Bilateral upper extremity supported Static Standing - Level of Assistance: 4: Min assist (while using urinal)   End of Session OT - End of Session Activity Tolerance: Patient limited by pain Patient left: in bed;with call bell/phone within reach Nurse Communication: Mobility status     Latoya Maulding OTR/L Pager number [phone] 11/21/2012, 12:53 PM

## 2012-11-21 NOTE — Progress Notes (Signed)
Physical Therapy Treatment Patient Details Name: Corey Romero MRN: [id number] DOB: 09/17/1950 Today's Date: 11/21/2012 Time: 5284-1324 PT Time Calculation (min): 24 min  PT Assessment / Plan / Recommendation  PT Comments   Pt. Appears to have given serious consideration to his progress thus far and has become much more motivated.  He says he really wants to DC home and "the talk this morning helped" to get him on track with his progress.  He was able to ambulate 14 feet  And overall managed his bed mobility better.  I believe it is becoming more feasible for DC home as of this session.  If he continues to progress tomorrow, will update recommendation back to home with HHPT and 24 hour assist.  Follow Up Recommendations  SNF;Supervision/Assistance - 24 hour;Supervision for mobility/OOB     Does the patient have the potential to tolerate intense rehabilitation     Barriers to Discharge        Equipment Recommendations  None recommended by PT    Recommendations for Other Services    Frequency 7X/week   Progress towards PT Goals Progress towards PT goals: Progressing toward goals  Plan Current plan remains appropriate    Precautions / Restrictions Precautions Precautions: Knee Restrictions Weight Bearing Restrictions: Yes RLE Weight Bearing: Weight bearing as tolerated   Pertinent Vitals/Pain Much less anxiousness over knee pain noted    Mobility  Bed Mobility Bed Mobility: Supine to Sit;Sitting - Scoot to Edge of Bed Supine to Sit: 3: Mod assist;HOB flat Supine to Sit: Patient Percentage: 60% Sitting - Scoot to Edge of Bed: 4: Min assist Sit to Supine: Not Tested (comment) Details for Bed Mobility Assistance: Pt. instructed in use of bed sheet to move R LE for bed mobility purposes.  He needed mod assist to elevate shoulders to sitting position.   Transfers Transfers: Sit to Stand;Stand to Sit Sit to Stand: 4: Min assist;From bed;From elevated surface;With upper extremity  assist Sit to Stand: Patient Percentage: 60% Stand to Sit: 3: Mod assist;To chair/3-in-1;With upper extremity assist;With armrests Stand to Sit: Patient Percentage: 70% Details for Transfer Assistance: Pt. needed min assist to rise to stand and mod assist to control descent to recliner chair.  He initiated much better this pm Ambulation/Gait Ambulation/Gait Assistance: 1: +2 Total assist Ambulation/Gait: Patient Percentage: 70% Ambulation Distance (Feet): 14 Feet Assistive device: Rolling walker Ambulation/Gait Assistance Details: Pt. able to tolerate ambulating 14 feet with standing rest break at halfway point.  Needs min assist for ambulation and second person to manage equipment. Gait Pattern: Step-to pattern;Decreased weight shift to right;Decreased stance time - right General Gait Details: better acceptance of weight onto R LE this afternoon    Exercises Total Joint Exercises Ankle Circles/Pumps: AROM;Both;10 reps Quad Sets: AROM;Right;10 reps Short Arc QuadBarbaraann Romero;Right;5 reps Knee Flexion: AROM;Right;5 reps;Seated Goniometric ROM: -7 to 40   PT Diagnosis:    PT Problem List:   PT Treatment Interventions:     PT Goals (current goals can now be found in the care plan section) Acute Rehab PT Goals Patient Stated Goal: wants to return home for rehab  Visit Information  Last PT Received On: 11/21/12 Assistance Needed: +2 History of Present Illness: Pt. underwent R TKA f endstage OA.  He had L  TKA 07/11/12.  so far, pt. has slowly and he says he is in much greater pain than prior TKA in [place].    Subjective Data  Subjective: "You gave me something to think about this morning  when you told me I might have to go for rehab.  I want to go home, so I have got to do this!" Patient Stated Goal: wants to return home for rehab   Cognition  Cognition Arousal/Alertness: Awake/alert Behavior During Therapy: WFL for tasks assessed/performed (pt. much less anxious; good  participation) Overall Cognitive Status: Within Functional Limits for tasks assessed    Balance  Balance Balance Assessed: Yes Static Standing Balance Static Standing - Balance Support: Bilateral upper extremity supported Static Standing - Level of Assistance: 4: Min assist (while using urinal)  End of Session PT - End of Session Equipment Utilized During Treatment: Gait belt Activity Tolerance: Patient tolerated treatment well Patient left: in chair;with call bell/phone within reach;with family/visitor present Nurse Communication: Mobility status   GP     Corey Romero 11/21/2012, 2:51 PM Corey Romero PT Acute Rehab Services [phone] Beeper [phone]

## 2012-11-21 NOTE — Progress Notes (Signed)
PATIENT ID: Corey Romero  MRN: [id number]  DOB/AGE:  [birth year] / 62 y.o.  2 Days Post-Op Procedure(s) (LRB): TOTAL KNEE ARTHROPLASTY (Right)    PROGRESS NOTE Subjective: Patient is alert, oriented, no Nausea, no Vomiting, yes passing gas, no Bowel Movement. Taking PO well. Denies SOB, Chest or Calf Pain. Using Incentive Spirometer, PAS in place. Ambulating slowly with PT - limited by pain. Patient reports pain as 7 on 0-10 scale  .    Objective: Vital signs in last 24 hours: Filed Vitals:   11/20/12 0534 11/20/12 1256 11/20/12 2033 11/21/12 0640  BP: 126/62 120/68 147/64 119/64  Pulse: 73 80 80 83  Temp: 98.9 F (37.2 C) 99.3 F (37.4 C) 98.5 F (36.9 C) 99.1 F (37.3 C)  TempSrc: Oral  Oral Oral  Resp: 16 16 16 16   Height:      Weight:      SpO2: 99% 97% 100% 99%      Intake/Output from previous day: I/O last 3 completed shifts: In: 1605.4 [P.O.:120; I.V.:1485.4] Out: 4635 [Urine:4425; Drains:210]   Intake/Output this shift:     LABORATORY DATA:  Recent Labs  11/20/12 0615  11/20/12 1551 11/20/12 2028 11/21/12 0515 11/21/12 0637  WBC 10.0  --   --   --  10.4  --   HGB 10.9*  --   --   --  10.0*  --   HCT 32.7*  --   --   --  29.6*  --   PLT 206  --   --   --  195  --   NA 130*  --   --   --   --   --   K 5.9*  --   --   --   --   --   CL 96  --   --   --   --   --   CO2 20  --   --   --   --   --   BUN 17  --   --   --   --   --   CREATININE 0.80  --   --   --   --   --   GLUCOSE 207*  --   --   --   --   --   GLUCAP  --   < > 236* 224*  --  272*  CALCIUM 9.1  --   --   --   --   --   < > = values in this interval not displayed.  Examination: Neurologically intact ABD soft Neurovascular intact Sensation intact distally Intact pulses distally Dorsiflexion/Plantar flexion intact Incision: dressing C/D/I} - dressing removed, incision benign  Assessment:   2 Days Post-Op Procedure(s) (LRB): TOTAL KNEE ARTHROPLASTY (Right) ADDITIONAL DIAGNOSIS:   Diabetes  Plan: PT/OT WBAT, CPM 5/hrs day until ROM 0-90 degrees, then D/C CPM DVT Prophylaxis:  SCDx72hrs, ASA 325 mg BID x 2 weeks  Will try oral Dilaudid with IV for breakthrough instead of Percocet.  If the patient's pain is well controlled with Dilaudid, he will be discharged home with that script (on chart) instead of Percocet.  DISCHARGE PLAN: Home when pain controlled and PT goals met. DISCHARGE NEEDS: HHPT, HHRN, CPM, Walker and 3-in-1 comode seat     Tryone Kille M. 11/21/2012, 7:33 AM

## 2012-11-21 NOTE — Progress Notes (Signed)
Physical Therapy Treatment Patient Details Name: Corey Romero MRN: [id number] DOB: 01-17-1951 Today's Date: 11/21/2012 Time: 4098-1191 PT Time Calculation (min): 23 min  PT Assessment / Plan / Recommendation  PT Comments   Pt. Is having a great deal of pain which is limiting his progress in PT.  AT this point, do not believe he will be safe or mobile enough to DC straight to home.  This pt. Will need ST SNF for rehab prior to DC home, unless he makes significant progress in next session or 2.  He acknowledges that he is slow to progress and has agreed to consider ST SNF for rehab before home.  Follow Up Recommendations  SNF;Supervision/Assistance - 24 hour;Supervision for mobility/OOB     Does the patient have the potential to tolerate intense rehabilitation     Barriers to Discharge        Equipment Recommendations  None recommended by PT    Recommendations for Other Services    Frequency 7X/week   Progress towards PT Goals Progress towards PT goals: Progressing toward goals  Plan Discharge plan needs to be updated    Precautions / Restrictions Precautions Precautions: Knee Restrictions Weight Bearing Restrictions: Yes RLE Weight Bearing: Weight bearing as tolerated   Pertinent Vitals/Pain See vitals tab     Mobility  Bed Mobility Bed Mobility: Supine to Sit;Sitting - Scoot to Edge of Bed Supine to Sit: 1: +2 Total assist Supine to Sit: Patient Percentage: 60% Sitting - Scoot to Edge of Bed: 4: Min assist Sit to Supine: Not Tested (comment) Details for Bed Mobility Assistance: manual assist to move R LE to edge of bed and for sitting upright from supine Transfers Transfers: Sit to Stand;Stand to Sit Sit to Stand: 1: +2 Total assist;From bed;With upper extremity assist;From elevated surface Sit to Stand: Patient Percentage: 60% Stand to Sit: 1: +2 Total assist;To chair/3-in-1;With armrests Stand to Sit: Patient Percentage: 70% Details for Transfer Assistance:  assist needed to rise to stand and to control descent Ambulation/Gait Ambulation/Gait Assistance: 1: +2 Total assist Ambulation/Gait: Patient Percentage: 70% Ambulation Distance (Feet): 3 Feet Assistive device: Rolling walker Ambulation/Gait Assistance Details: Pt. took 3 small turning steps to recliner with great difficulty in accepting weight onto right LE.  Needed 2 assist for support and balance Gait Pattern: Step-to pattern;Decreased weight shift to right;Decreased stance time - right    Exercises Total Joint Exercises Ankle Circles/Pumps: AROM;Both;10 reps Quad Sets: AROM;Right;10 reps Short Arc QuadBarbaraann Boys;Right;5 reps Knee Flexion: AROM;Right;5 reps;Seated Goniometric ROM: -8 to 40   PT Diagnosis:    PT Problem List:   PT Treatment Interventions:     PT Goals (current goals can now be found in the care plan section)    Visit Information  Last PT Received On: 11/21/12 Assistance Needed: +2 History of Present Illness: Pt. underwent R TKA f endstage OA.  He had L  TKA 07/11/12.  so far, pt. has slowly and he says he is in much greater pain than prior TKA in [place].    Subjective Data  Subjective: Pt. states his pain is much greater than what he experienced with the other TKA in Feb.   Cognition  Cognition Arousal/Alertness: Awake/alert Behavior During Therapy: WFL for tasks assessed/performed Overall Cognitive Status: Within Functional Limits for tasks assessed    Balance     End of Session PT - End of Session Equipment Utilized During Treatment: Gait belt Activity Tolerance: Patient limited by pain Patient left: in chair;with call bell/phone within  reach;with family/visitor present Nurse Communication: Mobility status   GP     [name] 11/21/2012, 12:23 PM Weldon Picking PT Acute Rehab Services [phone] [place] [phone]
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
## 2012-11-22 LAB — CBC
Platelets: 193 10*3/uL (ref 150–400)
RDW: 15.2 % (ref 11.5–15.5)
WBC: 9.3 10*3/uL (ref 4.0–10.5)

## 2012-11-22 LAB — GLUCOSE, CAPILLARY
Glucose-Capillary: 196 mg/dL — ABNORMAL HIGH (ref 70–99)
Glucose-Capillary: 139 mg/dL — ABNORMAL HIGH (ref 70–99)
Glucose-Capillary: 147 mg/dL — ABNORMAL HIGH (ref 70–99)

## 2012-11-22 NOTE — Progress Notes (Signed)
Subjective: 3 Days Post-Op Procedure(s) (LRB): TOTAL KNEE ARTHROPLASTY (Right)  Activity level:  Doing much better weightbearing as tolerated. Would like to go home Sunday and not go to a skilled nursing facility. Diet tolerance:  Eating Voiding:  Voiding Patient reports pain as 3 on 0-10 scale.    Objective: Vital signs in last 24 hours: Temp:  [98.1 F (36.7 C)-99.7 F (37.6 C)] 99.7 F (37.6 C) (07/12 0548) Pulse Rate:  [76-105] 92 (07/12 0548) Resp:  [16-18] 16 (07/12 0816) BP: (117-132)/(54-63) 117/63 mmHg (07/12 0548) SpO2:  [93 %-100 %] 93 % (07/12 0548)  Labs:  Recent Labs  11/20/12 0615 11/21/12 0515  HGB 10.9* 10.0*    Recent Labs  11/20/12 0615 11/21/12 0515  WBC 10.0 10.4  RBC 3.67* 3.34*  HCT 32.7* 29.6*  PLT 206 195    Recent Labs  11/20/12 0615  NA 130*  K 5.9*  CL 96  CO2 20  BUN 17  CREATININE 0.80  GLUCOSE 207*  CALCIUM 9.1   No results found for this basename: LABPT, INR,  in the last 72 hours  Physical Exam:  Neurologically intact Right knee wound within normal limits. No sign of infection. Good neurovascular status distally.  Assessment/Plan:  3 Days Post-Op Procedure(s) (LRB): TOTAL KNEE ARTHROPLASTY (Right) Advance diet Up with therapy Plan for discharge tomorrow Continue ASA 325 twice a day/SCDs. We'll discharge with home health physical therapy Sunday.    Keilen Kahl R 11/22/2012, 8:34 AM

## 2012-11-22 NOTE — Progress Notes (Signed)
Physical Therapy Treatment Patient Details Name: Corey Romero MRN: [id number] DOB: 1951-01-06 Today's Date: 11/22/2012 Time: 4098-1191 PT Time Calculation (min): 28 min  PT Assessment / Plan / Recommendation  PT Comments   Patient continues to progress steadily.  Still limited by pain and inability to bear weight on right LE.  Family in room states patient is doing much better than previous surgery in Feb!  Feel that with continued PT today and tomorrow, patient may be able to discharge home from PT standpoint on Sun afternoon/Mon morning.  Will need to ambulate further and practice stairs before d/c home.   Follow Up Recommendations  Home health PT;Supervision/Assistance - 24 hour     Does the patient have the potential to tolerate intense rehabilitation     Barriers to Discharge        Equipment Recommendations       Recommendations for Other Services    Frequency Min 1X/week   Progress towards PT Goals Progress towards PT goals: Progressing toward goals  Plan Discharge plan needs to be updated    Precautions / Restrictions Precautions Precautions: Knee Restrictions RLE Weight Bearing: Weight bearing as tolerated   Pertinent Vitals/Pain Patient with significant pain - see pain flowsheet    Mobility  Bed Mobility Bed Mobility: Supine to Sit;Sitting - Scoot to Edge of Bed Supine to Sit: 4: Min assist Sitting - Scoot to [place] of Bed: 4: Min assist Details for Bed Mobility Assistance: patient required assistance for right LE assistance; required verbal and tactile cueing for positioning and sequencing. Transfers Transfers: Sit to Stand;Stand to Sit Sit to Stand: 4: Min assist;From bed;From elevated surface;With upper extremity assist Stand to Sit: To chair/3-in-1;With upper extremity assist;With armrests;4: Min assist Ambulation/Gait Ambulation/Gait Assistance: 4: Min Environmental consultant (Feet): 20 Feet Assistive device: Rolling walker Ambulation/Gait Assistance  Details: patient with limited weight bearing on right LE; required "standing rest breaks" during ambulation. Gait Pattern: Step-to pattern;Decreased weight shift to right    Exercises Total Joint Exercises Ankle Circles/Pumps: AROM;Both;10 reps;Supine Quad Sets: AROM;Right;10 reps;Supine Gluteal Sets: AROM;Both;10 reps;Supine Short Arc Quad: AAROM;Right;10 reps;Supine Heel Slides: AAROM;Right;10 reps;Supine Hip ABduction/ADduction: AAROM;Right;10 reps;Supine Straight Leg Raises: AAROM;Right;10 reps;Supine Long Arc Quad: AAROM;Right;5 reps;Seated Knee Flexion: AAROM;Right;5 reps;Seated Goniometric ROM: flexion to 65   PT Diagnosis:    PT Problem List:   PT Treatment Interventions:     PT Goals (current goals can now be found in the care plan section)    Visit Information  Last PT Received On: 11/22/12 Assistance Needed: +1 Reason Eval/Treat Not Completed: Pain limiting ability to participate History of Present Illness: Pt. underwent R TKA for endstage OA.  He had L  TKA 07/11/12.  so far, pt. has slowly and he says he is in much greater pain than prior TKA in February    Subjective Data      Cognition  Cognition Arousal/Alertness: Awake/alert Behavior During Therapy: [facility] for tasks assessed/performed Overall Cognitive Status: Within Functional Limits for tasks assessed    Balance     End of Session PT - End of Session Equipment Utilized During Treatment: Gait belt Activity Tolerance: Patient tolerated treatment well Patient left: in chair;with call bell/phone within reach;with family/visitor present   GP     Olivia Canter, [place] [phone] 11/22/2012, 12:10 PM

## 2012-11-22 NOTE — Progress Notes (Signed)
11/22/12 1338  PT Visit Information  Last PT Received On 11/22/12  Assistance Needed +1  History of Present Illness Pt. underwent R TKA for endstage OA.  He had L  TKA 07/11/12.  so far, pt. has slowly and he says he is in much greater pain than prior TKA in February  PT Time Calculation  PT Start Time 1320  PT Stop Time 1336  PT Time Calculation (min) 16 min  Precautions  Precautions Knee  Restrictions  RLE Weight Bearing WBAT  Cognition  Arousal/Alertness Awake/alert  Behavior During Therapy WFL for tasks assessed/performed  Overall Cognitive Status Within Functional Limits for tasks assessed  Total Joint Exercises  Ankle Circles/Pumps AROM;Both;10 reps;Supine  Quad Sets AROM;Right;10 reps;Supine  Gluteal Sets AROM;Both;10 reps;Supine  Short Arc Quad AAROM;Right;10 reps;Supine  Heel Slides AAROM;Right;10 reps;Supine  Hip ABduction/ADduction AAROM;Right;10 reps;Supine  PT - End of Session  Activity Tolerance Patient tolerated treatment well  Patient left in bed;in CPM;with call bell/phone within reach  PT - Assessment/Plan  PT Frequency 7X/week  Follow Up Recommendations Home health PT;Supervision/Assistance - 24 hour  PT equipment None recommended by PT  PT Goal Progression  Progress towards PT goals Progressing toward goals  PT General Charges  $$ ACUTE PT VISIT 1 Procedure  PT Treatments  $Therapeutic Exercise 8-22 mins  11/22/2012 Corlis Hove, PT [phone]

## 2012-11-23 MED ORDER — METHOCARBAMOL 500 MG PO TABS: 500.0000 mg | ORAL_TABLET | Freq: Four times a day (QID) | ORAL | Status: DC | PRN

## 2012-11-23 NOTE — Progress Notes (Signed)
   CARE MANAGEMENT NOTE 11/23/2012  Patient:  BARTON, WANT   Account Number:  [id number]  Date Initiated:  11/20/2012  Documentation initiated by:  [facility]  Subjective/Objective Assessment:   admitted postop rt total knee arthroplasty     Action/Plan:   Plan return home with [facility]   Anticipated DC Date:  11/22/2012   Anticipated DC Plan:  HOME W HOME HEALTH SERVICES      DC Planning Services  CM consult      Choice offered to / List presented to:          [facility] arranged  HH-1 RN  HH-2 PT  HH-3 OT      [facility] agency  [facility]   Status of service:  Completed, signed off Medicare Important Message given?   (If response is "NO", the following Medicare IM given date fields will be blank) Date Medicare IM given:   Date Additional Medicare IM given:    Discharge Disposition:  HOME W HOME HEALTH SERVICES  Per UR Regulation:    If discussed at Long Length of Stay Meetings, dates discussed:    Comments:  11/23/2012 1600 Notified [facility] of dc home today. Isidoro Donning RN CCM Case Mgmt phone [phone]  11/21/12 Patient set up with HHPT, HHOT and HHRN with [facility] by MD office. Spoke with patient and he plans to d/c to home with [facility]HC. Spoke with PT and due to patient's lack of progress they may recommend SNF. Notified CSW of possible change in d/c plan depending on patient's progress. Will continue to follow for d/c needs. Jacquelynn Cree RN, BSN, CCM

## 2012-11-23 NOTE — Progress Notes (Signed)
Received DME from [facility]. Isidoro Donning RN CCM Case Mgmt phone [phone]

## 2012-11-23 NOTE — Progress Notes (Signed)
Physical Therapy Treatment Patient Details Name: Corey Romero MRN: [id number] DOB: 1951/03/12 Today's Date: 11/23/2012 Time: 0820-0856 PT Time Calculation (min): 36 min  PT Assessment / Plan / Recommendation  PT Comments   Pt continues to be limited by dyspnea on exertion, but still able to manage mobility tasks needed for dc home safely; OK for dc home from PT standpoint  Follow Up Recommendations  Home health PT;Supervision/Assistance - 24 hour     Does the patient have the potential to tolerate intense rehabilitation     Barriers to Discharge        Equipment Recommendations  None recommended by PT    Recommendations for Other Services    Frequency 7X/week   Progress towards PT Goals Progress towards PT goals: Progressing toward goals  Plan Current plan remains appropriate    Precautions / Restrictions Precautions Precautions: Knee Restrictions Weight Bearing Restrictions: Yes RLE Weight Bearing: Weight bearing as tolerated   Pertinent Vitals/Pain 5/10 pain R knee patient repositioned for comfort With ankle propped    Mobility  Bed Mobility Bed Mobility: Supine to Sit;Sitting - Scoot to Edge of Bed Supine to Sit: 4: Min assist Sitting - Scoot to [place] of Bed: 4: Min assist Details for Bed Mobility Assistance: patient required assistance for right LE assistance; required verbal and tactile cueing for positioning and sequencing. Transfers Transfers: Sit to Stand;Stand to Sit Sit to Stand: 4: Min assist;From bed;From elevated surface;With upper extremity assist Stand to Sit: To chair/3-in-1;With upper extremity assist;With armrests;4: Min assist Details for Transfer Assistance: Cues for technique and pre positioning for less pain with transitioning up and down; Requiring less assist with use of armrests to push up Ambulation/Gait Ambulation/Gait Assistance: 4: Min guard Ambulation Distance (Feet): 30 Feet Assistive device: Rolling walker Ambulation/Gait Assistance  Details: Better amb with more distance and less need for rest breaks; Noted DOE 2-3/4 Gait Pattern: Step-to pattern;Decreased weight shift to right;Trunk flexed General Gait Details: Cues to fully  extend hip for upright posture Stairs: Yes Stairs Assistance: 4: Min guard Stair Management Technique: One rail Right Number of Stairs: 3    Exercises Total Joint Exercises Ankle Circles/Pumps:  (Deferred therex to focus on mobility tasks)   PT Diagnosis:    PT Problem List:   PT Treatment Interventions:     PT Goals (current goals can now be found in the care plan section) Acute Rehab PT Goals Patient Stated Goal: wants to return home for rehab Time For Goal Achievement: 11/27/12 Potential to Achieve Goals: Good  Visit Information  Last PT Received On: 11/23/12 Assistance Needed: +1 History of Present Illness: Pt. underwent R TKA for endstage OA.  He had L  TKA 07/11/12.  so far, pt. has slowly and he says he is in much greater pain than prior TKA in February    Subjective Data  Subjective: Hoping to dc home today Patient Stated Goal: wants to return home for rehab   Cognition  Cognition Arousal/Alertness: Awake/alert Behavior During Therapy: [facility] for tasks assessed/performed Overall Cognitive Status: Within Functional Limits for tasks assessed    Balance     End of Session PT - End of Session Equipment Utilized During Treatment: Gait belt Activity Tolerance: Patient tolerated treatment well Patient left: in chair;with call bell/phone within reach Nurse Communication: Mobility status CPM Right Knee CPM Right Knee: Off   GP     Corey Romero [employer], [place] [phone]  11/23/2012, 10:38 AM
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
## 2012-11-23 NOTE — Progress Notes (Signed)
CSW received consult for SNF placement, note plan now is for d/c home today with home health. No CSW needs identified - CSW signing off.   Unice Bailey, LCSW Clinical Social Worker cell #: [phone] (weekend coverage)

## 2012-11-23 NOTE — Progress Notes (Signed)
Subjective: 4 Days Post-Op Procedure(s) (LRB): TOTAL KNEE ARTHROPLASTY (Right)  Activity level:  wbat Diet tolerance:  ok Voiding:  ok Patient reports pain as 2 on 0-10 scale.    Objective: Vital signs in last 24 hours: Temp:  [98.6 F (37 C)-99.1 F (37.3 C)] 98.6 F (37 C) (07/13 0529) Pulse Rate:  [77-97] 97 (07/13 0958) Resp:  [18] 18 (07/13 0741) BP: (116-137)/(62-78) 128/63 mmHg (07/13 0958) SpO2:  [95 %-99 %] 95 % (07/13 0741)  Labs:  Recent Labs  11/21/12 0515 11/22/12 0500  HGB 10.0* 9.1*    Recent Labs  11/21/12 0515 11/22/12 0500  WBC 10.4 9.3  RBC 3.34* 3.07*  HCT 29.6* 27.2*  PLT 195 193   No results found for this basename: NA, K, CL, CO2, BUN, CREATININE, GLUCOSE, CALCIUM,  in the last 72 hours No results found for this basename: LABPT, INR,  in the last 72 hours  Physical Exam:  Neurologically intact ABD soft Neurovascular intact Sensation intact distally Intact pulses distally Dorsiflexion/Plantar flexion intact Incision: dressing C/D/I No cellulitis present Compartment soft  Assessment/Plan:  4 Days Post-Op Procedure(s) (LRB): TOTAL KNEE ARTHROPLASTY (Right) Advance diet Up with therapy Discharge home with home health Percocet/ ASA 325 bid  rto 2 weeks HH for pt    Corey Romero 11/23/2012, 11:00 AM

## 2013-03-21 ENCOUNTER — Encounter: Payer: Self-pay | Admitting: Cardiology

## 2013-03-21 ENCOUNTER — Encounter: Payer: Self-pay | Admitting: *Deleted

## 2013-03-21 DIAGNOSIS — F329 Major depressive disorder, single episode, unspecified: Secondary | ICD-10-CM | POA: Insufficient documentation

## 2013-03-21 DIAGNOSIS — I2510 Atherosclerotic heart disease of native coronary artery without angina pectoris: Secondary | ICD-10-CM | POA: Insufficient documentation

## 2013-03-25 ENCOUNTER — Ambulatory Visit (INDEPENDENT_AMBULATORY_CARE_PROVIDER_SITE_OTHER): Payer: BC Managed Care – PPO | Admitting: Cardiology

## 2013-03-25 ENCOUNTER — Encounter: Payer: Self-pay | Admitting: Cardiology

## 2013-03-25 DIAGNOSIS — I252 Old myocardial infarction: Secondary | ICD-10-CM

## 2013-03-25 DIAGNOSIS — I2510 Atherosclerotic heart disease of native coronary artery without angina pectoris: Secondary | ICD-10-CM

## 2013-03-25 HISTORY — DX: Morbid (severe) obesity due to excess calories: E66.01

## 2013-03-25 NOTE — Patient Instructions (Signed)
Your physician recommends that you continue on your current medications as directed. Please refer to the Current Medication list given to you today.  Your physician wants you to follow-up in: 1 year with Dr. Skains. You will receive a reminder letter in the mail two months in advance. If you don't receive a letter, please call our office to schedule the follow-up appointment.  
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
## 2013-07-02 ENCOUNTER — Ambulatory Visit: Payer: BC Managed Care – PPO | Admitting: Cardiology
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
## 2015-03-02 DIAGNOSIS — E756 Lipid storage disorder, unspecified: Secondary | ICD-10-CM | POA: Diagnosis not present

## 2015-03-02 DIAGNOSIS — E118 Type 2 diabetes mellitus with unspecified complications: Secondary | ICD-10-CM | POA: Diagnosis not present

## 2015-03-02 DIAGNOSIS — E119 Type 2 diabetes mellitus without complications: Secondary | ICD-10-CM | POA: Diagnosis not present

## 2015-03-10 DIAGNOSIS — E118 Type 2 diabetes mellitus with unspecified complications: Secondary | ICD-10-CM | POA: Diagnosis not present

## 2015-03-10 DIAGNOSIS — E789 Disorder of lipoprotein metabolism, unspecified: Secondary | ICD-10-CM | POA: Diagnosis not present

## 2015-03-10 DIAGNOSIS — I739 Peripheral vascular disease, unspecified: Secondary | ICD-10-CM | POA: Diagnosis not present

## 2015-03-10 DIAGNOSIS — N390 Urinary tract infection, site not specified: Secondary | ICD-10-CM | POA: Diagnosis not present

## 2015-04-27 DIAGNOSIS — N390 Urinary tract infection, site not specified: Secondary | ICD-10-CM | POA: Diagnosis not present

## 2015-04-27 DIAGNOSIS — R350 Frequency of micturition: Secondary | ICD-10-CM | POA: Diagnosis not present

## 2015-04-27 DIAGNOSIS — R3989 Other symptoms and signs involving the genitourinary system: Secondary | ICD-10-CM | POA: Diagnosis not present

## 2015-05-06 DIAGNOSIS — N321 Vesicointestinal fistula: Secondary | ICD-10-CM | POA: Diagnosis not present

## 2015-05-06 DIAGNOSIS — R3989 Other symptoms and signs involving the genitourinary system: Secondary | ICD-10-CM | POA: Diagnosis not present

## 2015-05-06 DIAGNOSIS — N360 Urethral fistula: Secondary | ICD-10-CM | POA: Diagnosis not present

## 2015-05-06 DIAGNOSIS — K760 Fatty (change of) liver, not elsewhere classified: Secondary | ICD-10-CM | POA: Diagnosis not present

## 2015-05-19 DIAGNOSIS — N321 Vesicointestinal fistula: Secondary | ICD-10-CM | POA: Diagnosis not present

## 2015-05-24 DIAGNOSIS — Z01818 Encounter for other preprocedural examination: Secondary | ICD-10-CM | POA: Diagnosis not present

## 2015-05-25 DIAGNOSIS — E118 Type 2 diabetes mellitus with unspecified complications: Secondary | ICD-10-CM | POA: Diagnosis not present

## 2015-05-25 DIAGNOSIS — I10 Essential (primary) hypertension: Secondary | ICD-10-CM | POA: Diagnosis not present

## 2015-05-26 DIAGNOSIS — N321 Vesicointestinal fistula: Secondary | ICD-10-CM | POA: Diagnosis not present

## 2015-05-26 DIAGNOSIS — D126 Benign neoplasm of colon, unspecified: Secondary | ICD-10-CM | POA: Diagnosis not present

## 2015-05-26 DIAGNOSIS — K635 Polyp of colon: Secondary | ICD-10-CM | POA: Diagnosis not present

## 2015-05-27 DIAGNOSIS — I10 Essential (primary) hypertension: Secondary | ICD-10-CM | POA: Diagnosis present

## 2015-05-27 DIAGNOSIS — N321 Vesicointestinal fistula: Secondary | ICD-10-CM | POA: Diagnosis present

## 2015-05-27 DIAGNOSIS — I2510 Atherosclerotic heart disease of native coronary artery without angina pectoris: Secondary | ICD-10-CM | POA: Diagnosis present

## 2015-05-27 DIAGNOSIS — Z87891 Personal history of nicotine dependence: Secondary | ICD-10-CM | POA: Diagnosis not present

## 2015-05-27 DIAGNOSIS — E669 Obesity, unspecified: Secondary | ICD-10-CM | POA: Diagnosis present

## 2015-05-27 DIAGNOSIS — K5732 Diverticulitis of large intestine without perforation or abscess without bleeding: Secondary | ICD-10-CM | POA: Diagnosis present

## 2015-05-27 DIAGNOSIS — Z794 Long term (current) use of insulin: Secondary | ICD-10-CM | POA: Diagnosis not present

## 2015-05-27 DIAGNOSIS — E119 Type 2 diabetes mellitus without complications: Secondary | ICD-10-CM | POA: Diagnosis present

## 2015-05-27 DIAGNOSIS — K5790 Diverticulosis of intestine, part unspecified, without perforation or abscess without bleeding: Secondary | ICD-10-CM | POA: Diagnosis not present

## 2015-05-27 DIAGNOSIS — Z955 Presence of coronary angioplasty implant and graft: Secondary | ICD-10-CM | POA: Diagnosis not present

## 2015-05-27 DIAGNOSIS — Z7984 Long term (current) use of oral hypoglycemic drugs: Secondary | ICD-10-CM | POA: Diagnosis not present

## 2015-05-27 DIAGNOSIS — Z6841 Body Mass Index (BMI) 40.0 and over, adult: Secondary | ICD-10-CM | POA: Diagnosis not present

## 2015-05-27 DIAGNOSIS — Z466 Encounter for fitting and adjustment of urinary device: Secondary | ICD-10-CM | POA: Diagnosis not present

## 2015-05-27 DIAGNOSIS — Z79899 Other long term (current) drug therapy: Secondary | ICD-10-CM | POA: Diagnosis not present

## 2015-05-27 DIAGNOSIS — E785 Hyperlipidemia, unspecified: Secondary | ICD-10-CM | POA: Diagnosis present

## 2015-05-27 DIAGNOSIS — K635 Polyp of colon: Secondary | ICD-10-CM | POA: Diagnosis present

## 2015-05-27 DIAGNOSIS — K648 Other hemorrhoids: Secondary | ICD-10-CM | POA: Diagnosis present

## 2015-05-27 DIAGNOSIS — N1370 Vesicoureteral-reflux, unspecified: Secondary | ICD-10-CM | POA: Diagnosis not present

## 2015-05-27 DIAGNOSIS — K5792 Diverticulitis of intestine, part unspecified, without perforation or abscess without bleeding: Secondary | ICD-10-CM | POA: Diagnosis not present

## 2015-07-05 DIAGNOSIS — E756 Lipid storage disorder, unspecified: Secondary | ICD-10-CM | POA: Diagnosis not present

## 2015-07-05 DIAGNOSIS — E118 Type 2 diabetes mellitus with unspecified complications: Secondary | ICD-10-CM | POA: Diagnosis not present

## 2015-07-05 DIAGNOSIS — E789 Disorder of lipoprotein metabolism, unspecified: Secondary | ICD-10-CM | POA: Diagnosis not present

## 2015-07-18 DIAGNOSIS — E118 Type 2 diabetes mellitus with unspecified complications: Secondary | ICD-10-CM | POA: Diagnosis not present

## 2015-10-24 DIAGNOSIS — Z7984 Long term (current) use of oral hypoglycemic drugs: Secondary | ICD-10-CM | POA: Diagnosis not present

## 2015-10-24 DIAGNOSIS — H2513 Age-related nuclear cataract, bilateral: Secondary | ICD-10-CM | POA: Diagnosis not present

## 2015-10-24 DIAGNOSIS — H43813 Vitreous degeneration, bilateral: Secondary | ICD-10-CM | POA: Diagnosis not present

## 2015-10-24 DIAGNOSIS — H524 Presbyopia: Secondary | ICD-10-CM | POA: Diagnosis not present

## 2015-10-24 DIAGNOSIS — E11319 Type 2 diabetes mellitus with unspecified diabetic retinopathy without macular edema: Secondary | ICD-10-CM | POA: Diagnosis not present

## 2015-10-24 DIAGNOSIS — H5203 Hypermetropia, bilateral: Secondary | ICD-10-CM | POA: Diagnosis not present

## 2015-10-24 DIAGNOSIS — H52223 Regular astigmatism, bilateral: Secondary | ICD-10-CM | POA: Diagnosis not present

## 2015-11-10 DIAGNOSIS — E756 Lipid storage disorder, unspecified: Secondary | ICD-10-CM | POA: Diagnosis not present

## 2015-11-10 DIAGNOSIS — E119 Type 2 diabetes mellitus without complications: Secondary | ICD-10-CM | POA: Diagnosis not present

## 2015-11-22 DIAGNOSIS — F5101 Primary insomnia: Secondary | ICD-10-CM | POA: Diagnosis not present

## 2015-11-22 DIAGNOSIS — F419 Anxiety disorder, unspecified: Secondary | ICD-10-CM | POA: Diagnosis not present

## 2015-11-22 DIAGNOSIS — E118 Type 2 diabetes mellitus with unspecified complications: Secondary | ICD-10-CM | POA: Diagnosis not present

## 2016-01-17 DIAGNOSIS — Z23 Encounter for immunization: Secondary | ICD-10-CM | POA: Diagnosis not present

## 2016-03-14 DIAGNOSIS — E119 Type 2 diabetes mellitus without complications: Secondary | ICD-10-CM | POA: Diagnosis not present

## 2016-03-14 DIAGNOSIS — E789 Disorder of lipoprotein metabolism, unspecified: Secondary | ICD-10-CM | POA: Diagnosis not present

## 2016-03-22 DIAGNOSIS — E118 Type 2 diabetes mellitus with unspecified complications: Secondary | ICD-10-CM | POA: Diagnosis not present

## 2016-03-22 DIAGNOSIS — M549 Dorsalgia, unspecified: Secondary | ICD-10-CM | POA: Diagnosis not present

## 2016-03-29 DIAGNOSIS — M25562 Pain in left knee: Secondary | ICD-10-CM | POA: Diagnosis not present

## 2016-03-29 DIAGNOSIS — E119 Type 2 diabetes mellitus without complications: Secondary | ICD-10-CM | POA: Diagnosis not present

## 2016-03-29 DIAGNOSIS — M7632 Iliotibial band syndrome, left leg: Secondary | ICD-10-CM | POA: Diagnosis not present

## 2016-03-29 DIAGNOSIS — M5416 Radiculopathy, lumbar region: Secondary | ICD-10-CM | POA: Diagnosis not present

## 2016-03-29 DIAGNOSIS — Z96653 Presence of artificial knee joint, bilateral: Secondary | ICD-10-CM | POA: Diagnosis not present

## 2016-06-25 DIAGNOSIS — E119 Type 2 diabetes mellitus without complications: Secondary | ICD-10-CM | POA: Diagnosis not present

## 2016-06-25 DIAGNOSIS — E789 Disorder of lipoprotein metabolism, unspecified: Secondary | ICD-10-CM | POA: Diagnosis not present

## 2016-06-28 DIAGNOSIS — E789 Disorder of lipoprotein metabolism, unspecified: Secondary | ICD-10-CM | POA: Diagnosis not present

## 2016-06-28 DIAGNOSIS — E118 Type 2 diabetes mellitus with unspecified complications: Secondary | ICD-10-CM | POA: Diagnosis not present

## 2016-06-28 DIAGNOSIS — M1990 Unspecified osteoarthritis, unspecified site: Secondary | ICD-10-CM | POA: Diagnosis not present

## 2016-09-20 DIAGNOSIS — E119 Type 2 diabetes mellitus without complications: Secondary | ICD-10-CM | POA: Diagnosis not present

## 2016-09-20 DIAGNOSIS — E789 Disorder of lipoprotein metabolism, unspecified: Secondary | ICD-10-CM | POA: Diagnosis not present

## 2016-09-26 DIAGNOSIS — M25569 Pain in unspecified knee: Secondary | ICD-10-CM | POA: Diagnosis not present

## 2016-09-26 DIAGNOSIS — M549 Dorsalgia, unspecified: Secondary | ICD-10-CM | POA: Diagnosis not present

## 2016-09-26 DIAGNOSIS — E118 Type 2 diabetes mellitus with unspecified complications: Secondary | ICD-10-CM | POA: Diagnosis not present

## 2016-10-17 DIAGNOSIS — H524 Presbyopia: Secondary | ICD-10-CM | POA: Diagnosis not present

## 2016-10-17 DIAGNOSIS — Z7984 Long term (current) use of oral hypoglycemic drugs: Secondary | ICD-10-CM | POA: Diagnosis not present

## 2016-10-17 DIAGNOSIS — H52223 Regular astigmatism, bilateral: Secondary | ICD-10-CM | POA: Diagnosis not present

## 2016-10-17 DIAGNOSIS — H5203 Hypermetropia, bilateral: Secondary | ICD-10-CM | POA: Diagnosis not present

## 2016-10-17 DIAGNOSIS — E11319 Type 2 diabetes mellitus with unspecified diabetic retinopathy without macular edema: Secondary | ICD-10-CM | POA: Diagnosis not present

## 2016-10-23 DIAGNOSIS — Z0000 Encounter for general adult medical examination without abnormal findings: Secondary | ICD-10-CM | POA: Diagnosis not present

## 2016-10-23 DIAGNOSIS — N390 Urinary tract infection, site not specified: Secondary | ICD-10-CM | POA: Diagnosis not present

## 2016-10-23 DIAGNOSIS — E789 Disorder of lipoprotein metabolism, unspecified: Secondary | ICD-10-CM | POA: Diagnosis not present

## 2016-10-23 DIAGNOSIS — E119 Type 2 diabetes mellitus without complications: Secondary | ICD-10-CM | POA: Diagnosis not present

## 2016-10-23 DIAGNOSIS — Z125 Encounter for screening for malignant neoplasm of prostate: Secondary | ICD-10-CM | POA: Diagnosis not present

## 2016-10-30 DIAGNOSIS — I10 Essential (primary) hypertension: Secondary | ICD-10-CM | POA: Diagnosis not present

## 2016-10-30 DIAGNOSIS — M25569 Pain in unspecified knee: Secondary | ICD-10-CM | POA: Diagnosis not present

## 2016-10-30 DIAGNOSIS — E118 Type 2 diabetes mellitus with unspecified complications: Secondary | ICD-10-CM | POA: Diagnosis not present

## 2017-02-21 DIAGNOSIS — Z0000 Encounter for general adult medical examination without abnormal findings: Secondary | ICD-10-CM | POA: Diagnosis not present

## 2017-02-21 DIAGNOSIS — E789 Disorder of lipoprotein metabolism, unspecified: Secondary | ICD-10-CM | POA: Diagnosis not present

## 2017-02-21 DIAGNOSIS — E119 Type 2 diabetes mellitus without complications: Secondary | ICD-10-CM | POA: Diagnosis not present

## 2017-02-27 DIAGNOSIS — F419 Anxiety disorder, unspecified: Secondary | ICD-10-CM | POA: Diagnosis not present

## 2017-02-27 DIAGNOSIS — I2510 Atherosclerotic heart disease of native coronary artery without angina pectoris: Secondary | ICD-10-CM | POA: Diagnosis not present

## 2017-02-27 DIAGNOSIS — E789 Disorder of lipoprotein metabolism, unspecified: Secondary | ICD-10-CM | POA: Diagnosis not present

## 2017-02-27 DIAGNOSIS — E119 Type 2 diabetes mellitus without complications: Secondary | ICD-10-CM | POA: Diagnosis not present

## 2017-02-27 DIAGNOSIS — Z23 Encounter for immunization: Secondary | ICD-10-CM | POA: Diagnosis not present

## 2017-02-27 DIAGNOSIS — Z5181 Encounter for therapeutic drug level monitoring: Secondary | ICD-10-CM | POA: Diagnosis not present

## 2017-02-27 DIAGNOSIS — Z79899 Other long term (current) drug therapy: Secondary | ICD-10-CM | POA: Diagnosis not present

## 2017-05-21 DIAGNOSIS — E789 Disorder of lipoprotein metabolism, unspecified: Secondary | ICD-10-CM | POA: Diagnosis not present

## 2017-05-21 DIAGNOSIS — Z79899 Other long term (current) drug therapy: Secondary | ICD-10-CM | POA: Diagnosis not present

## 2017-05-21 DIAGNOSIS — E119 Type 2 diabetes mellitus without complications: Secondary | ICD-10-CM | POA: Diagnosis not present

## 2017-05-21 DIAGNOSIS — Z5181 Encounter for therapeutic drug level monitoring: Secondary | ICD-10-CM | POA: Diagnosis not present

## 2017-05-30 DIAGNOSIS — G8929 Other chronic pain: Secondary | ICD-10-CM | POA: Diagnosis not present

## 2017-05-30 DIAGNOSIS — M25561 Pain in right knee: Secondary | ICD-10-CM | POA: Diagnosis not present

## 2017-05-30 DIAGNOSIS — F419 Anxiety disorder, unspecified: Secondary | ICD-10-CM | POA: Diagnosis not present

## 2017-05-30 DIAGNOSIS — Z794 Long term (current) use of insulin: Secondary | ICD-10-CM | POA: Diagnosis not present

## 2017-05-30 DIAGNOSIS — E119 Type 2 diabetes mellitus without complications: Secondary | ICD-10-CM | POA: Diagnosis not present

## 2017-06-27 DIAGNOSIS — I10 Essential (primary) hypertension: Secondary | ICD-10-CM | POA: Diagnosis not present

## 2017-06-27 DIAGNOSIS — E118 Type 2 diabetes mellitus with unspecified complications: Secondary | ICD-10-CM | POA: Diagnosis not present

## 2017-06-27 DIAGNOSIS — Z794 Long term (current) use of insulin: Secondary | ICD-10-CM | POA: Diagnosis not present

## 2017-06-27 DIAGNOSIS — E7800 Pure hypercholesterolemia, unspecified: Secondary | ICD-10-CM | POA: Diagnosis not present

## 2017-09-02 DIAGNOSIS — E789 Disorder of lipoprotein metabolism, unspecified: Secondary | ICD-10-CM | POA: Diagnosis not present

## 2017-09-02 DIAGNOSIS — E118 Type 2 diabetes mellitus with unspecified complications: Secondary | ICD-10-CM | POA: Diagnosis not present

## 2017-09-02 DIAGNOSIS — Z794 Long term (current) use of insulin: Secondary | ICD-10-CM | POA: Diagnosis not present

## 2017-09-02 DIAGNOSIS — Z5181 Encounter for therapeutic drug level monitoring: Secondary | ICD-10-CM | POA: Diagnosis not present

## 2017-09-05 DIAGNOSIS — E118 Type 2 diabetes mellitus with unspecified complications: Secondary | ICD-10-CM | POA: Diagnosis not present

## 2017-09-05 DIAGNOSIS — E789 Disorder of lipoprotein metabolism, unspecified: Secondary | ICD-10-CM | POA: Diagnosis not present

## 2017-09-05 DIAGNOSIS — I2510 Atherosclerotic heart disease of native coronary artery without angina pectoris: Secondary | ICD-10-CM | POA: Diagnosis not present

## 2017-09-05 DIAGNOSIS — Z79899 Other long term (current) drug therapy: Secondary | ICD-10-CM | POA: Diagnosis not present

## 2017-09-05 DIAGNOSIS — F419 Anxiety disorder, unspecified: Secondary | ICD-10-CM | POA: Diagnosis not present

## 2017-09-05 DIAGNOSIS — I10 Essential (primary) hypertension: Secondary | ICD-10-CM | POA: Diagnosis not present

## 2017-09-12 DIAGNOSIS — I2510 Atherosclerotic heart disease of native coronary artery without angina pectoris: Secondary | ICD-10-CM | POA: Diagnosis not present

## 2017-09-12 DIAGNOSIS — Z794 Long term (current) use of insulin: Secondary | ICD-10-CM | POA: Diagnosis not present

## 2017-09-12 DIAGNOSIS — E1165 Type 2 diabetes mellitus with hyperglycemia: Secondary | ICD-10-CM | POA: Diagnosis not present

## 2017-09-12 DIAGNOSIS — I493 Ventricular premature depolarization: Secondary | ICD-10-CM | POA: Diagnosis not present

## 2017-09-23 ENCOUNTER — Encounter: Payer: Self-pay | Admitting: Cardiology

## 2017-09-23 DIAGNOSIS — I2510 Atherosclerotic heart disease of native coronary artery without angina pectoris: Secondary | ICD-10-CM | POA: Diagnosis not present

## 2017-09-23 DIAGNOSIS — R0609 Other forms of dyspnea: Secondary | ICD-10-CM | POA: Diagnosis not present

## 2017-10-08 DIAGNOSIS — I493 Ventricular premature depolarization: Secondary | ICD-10-CM | POA: Diagnosis not present

## 2017-10-08 DIAGNOSIS — R0609 Other forms of dyspnea: Secondary | ICD-10-CM | POA: Diagnosis not present

## 2017-10-08 DIAGNOSIS — I2510 Atherosclerotic heart disease of native coronary artery without angina pectoris: Secondary | ICD-10-CM | POA: Diagnosis not present

## 2017-10-16 DIAGNOSIS — Z794 Long term (current) use of insulin: Secondary | ICD-10-CM | POA: Diagnosis not present

## 2017-10-16 DIAGNOSIS — I493 Ventricular premature depolarization: Secondary | ICD-10-CM | POA: Diagnosis not present

## 2017-10-16 DIAGNOSIS — I25118 Atherosclerotic heart disease of native coronary artery with other forms of angina pectoris: Secondary | ICD-10-CM | POA: Diagnosis not present

## 2017-10-16 DIAGNOSIS — E1165 Type 2 diabetes mellitus with hyperglycemia: Secondary | ICD-10-CM | POA: Diagnosis not present

## 2017-10-21 DIAGNOSIS — I493 Ventricular premature depolarization: Secondary | ICD-10-CM | POA: Diagnosis not present

## 2017-11-04 DIAGNOSIS — H25019 Cortical age-related cataract, unspecified eye: Secondary | ICD-10-CM | POA: Diagnosis not present

## 2017-11-18 DIAGNOSIS — I493 Ventricular premature depolarization: Secondary | ICD-10-CM | POA: Diagnosis not present

## 2017-11-18 DIAGNOSIS — Z5181 Encounter for therapeutic drug level monitoring: Secondary | ICD-10-CM | POA: Diagnosis not present

## 2017-11-18 DIAGNOSIS — E1165 Type 2 diabetes mellitus with hyperglycemia: Secondary | ICD-10-CM | POA: Diagnosis not present

## 2017-11-18 DIAGNOSIS — Z794 Long term (current) use of insulin: Secondary | ICD-10-CM | POA: Diagnosis not present

## 2017-11-18 DIAGNOSIS — Z79899 Other long term (current) drug therapy: Secondary | ICD-10-CM | POA: Diagnosis not present

## 2017-11-18 DIAGNOSIS — E118 Type 2 diabetes mellitus with unspecified complications: Secondary | ICD-10-CM | POA: Diagnosis not present

## 2017-11-18 DIAGNOSIS — I25118 Atherosclerotic heart disease of native coronary artery with other forms of angina pectoris: Secondary | ICD-10-CM | POA: Diagnosis not present

## 2017-11-21 DIAGNOSIS — E119 Type 2 diabetes mellitus without complications: Secondary | ICD-10-CM | POA: Diagnosis not present

## 2017-11-21 DIAGNOSIS — I2510 Atherosclerotic heart disease of native coronary artery without angina pectoris: Secondary | ICD-10-CM | POA: Diagnosis not present

## 2017-11-21 DIAGNOSIS — Z79899 Other long term (current) drug therapy: Secondary | ICD-10-CM | POA: Diagnosis not present

## 2018-01-16 DIAGNOSIS — E119 Type 2 diabetes mellitus without complications: Secondary | ICD-10-CM | POA: Diagnosis not present

## 2018-01-23 DIAGNOSIS — Z79899 Other long term (current) drug therapy: Secondary | ICD-10-CM | POA: Diagnosis not present

## 2018-01-23 DIAGNOSIS — E119 Type 2 diabetes mellitus without complications: Secondary | ICD-10-CM | POA: Diagnosis not present

## 2018-01-23 DIAGNOSIS — F419 Anxiety disorder, unspecified: Secondary | ICD-10-CM | POA: Diagnosis not present

## 2018-01-23 DIAGNOSIS — F5101 Primary insomnia: Secondary | ICD-10-CM | POA: Diagnosis not present

## 2018-01-23 DIAGNOSIS — G8929 Other chronic pain: Secondary | ICD-10-CM | POA: Diagnosis not present

## 2018-02-24 DIAGNOSIS — I25118 Atherosclerotic heart disease of native coronary artery with other forms of angina pectoris: Secondary | ICD-10-CM | POA: Diagnosis not present

## 2018-02-24 DIAGNOSIS — E1165 Type 2 diabetes mellitus with hyperglycemia: Secondary | ICD-10-CM | POA: Diagnosis not present

## 2018-02-24 DIAGNOSIS — I493 Ventricular premature depolarization: Secondary | ICD-10-CM | POA: Diagnosis not present

## 2018-02-24 DIAGNOSIS — Z794 Long term (current) use of insulin: Secondary | ICD-10-CM | POA: Diagnosis not present

## 2018-03-08 DIAGNOSIS — R0600 Dyspnea, unspecified: Secondary | ICD-10-CM

## 2018-03-08 DIAGNOSIS — R0609 Other forms of dyspnea: Secondary | ICD-10-CM

## 2018-03-08 HISTORY — DX: Dyspnea, unspecified: R06.00

## 2018-03-08 HISTORY — DX: Other forms of dyspnea: R06.09

## 2018-03-08 NOTE — H&P (Signed)
OFFICE VISIT NOTES COPIED TO EPIC FOR DOCUMENTATION  History of Present Illness Gwinda Maine FNP-C; 02/25/2018 9:03 PM) Patient words: 3 month f/u for CAD & PVC's; Last OV 11/18/2017 Pt c/o getting sob when walking but its happeneing too fast; Pt is upset about the weight gain.  The patient is a 67 year old male who presents for a Follow-up for Coronary artery disease. Mr. Terril Chestnut is a 67 year old Caucasian male with past medical history of CAD with angioplasty and 1990s by Dr. [facility], type 2 diabetes, anxiety, hypertension, former tobacco use, and hyperlipidemia. Recently referred to [place] for continued management of CAD and evaluation of asymptomatic frequent PVCs. Underwent lexiscan nuclear stress test that was considered high risk due to large area of infarct without any evidence of ischemia. Echocardiogram revealed left atrial enlargement and severe LVH, low-normal LVEF of 50%.  Due to frequent PVC's, wore 48 hour Holter monitor that revealed 16% ventricular burden, metoprolol tartrate was increased to 50 mg twice a day and PVC have since resolved. He now presents for 3 month office visit.  He reports that dyspnea on exertion has gotten significantly worse. He was previously only having dyspne on extreme exertion; however, now has it walking around his house. He feels that something is wrong and is concerned. He has not had any exertional chest pain. No leg edema. Does state that he has gained 20 lbs since last seen. Tolerating medication well. I had previously discussed possible evaluation with sleep study given frequent PVCs and echo findings; however, patient denied as he did not want to wear a CPAP mask.  Problem List/Past Medical Frances Furbish Johnson; 03-19-2018 2:04 PM) Anxiety (F41.9)  Coronary artery disease of native artery of native heart with stable angina pectoris (I25.118)  S/P angioplasty by Dr. Glade Lloyd in [phone] EKGs 11/18/2017: Normal sinus rhythm at 72 bpm with left atrial  enlargement and first-degree AV block, one PAC, left axis deviation and poor R-wave progression cannot exclude anterior infarct old. No evidence of ischemia. Compared to previous EKG, frequent PVC have resolved Uncontrolled type 2 diabetes mellitus without complication, with long-term current use of insulin (E11.65)  Benign essential hypertension (I10)  EKG 09/12/2017: Sinus rhythm w/frequent PVC's and intermittent bigeminy. Possible old inferior infarct. Poor R wave progression Laboratory examination (Z01.89)  09/02/2017: Creatinine 1.1, potassium 4.7, EGFR 73/89, CMP normal. Hemoglobin A1c 9%. Cholesterol 148, triglycerides 120, HDL 39, LDL 85. Frequent PVCs (I49.3)  48 hour Holter monitor 10/16/2017-10/18/2017: Resting heart rate 75 bpm. Total ventricular ectopic beats 15,924. Ventricular burdened 16.2%. 1833 bigeminy. 13,329 of single PVCs. Longest bradycardia episode at 54 bpm occurred on day 2 at 1:49 AM. He had 4 minutes of sinus tachycardia with single fastest episode occurring on day 2 at 2:17 PM with maximum heart rate of 126 bpm. No A. fib occurred. One reported symptom of shortness of breath more than usual occurred Day 1 at 3:15 PM, correlated rhythm revealed artifact. Dyspnea on exertion (R06.09)  Echocardiogram 10/08/2017: Left ventricle cavity is normal in size. Severe concentric hypertrophy of the left ventricle. Mild decrease in global wall motion. Indeterminate diastolic filling pattern. Calculated EF 50%. Left atrial cavity is moderately dilated. Inadequate tricuspid regurgitation jet to estimate pulmonary artery pressure IVC is dilated with blunted respiratory response. This suggests elevated central venous pressure. Morbid obesity (E66.01)  Claudication (I73.9)   Allergies Frances Furbish Johnson; 03/19/2018 2:04 PM) No Known Drug Allergies [09/12/2017]:  Family History Cheri Kearns; 2018/03/19 2:04 PM) Mother  Deceased. at age  31 from old age, no heart attacks or strokes, no known  cardiovascular conditions Father  Deceased. at age 27 from heart attack- two prior to this one Sister 10  younger-obesity Brother 2  1 older 1 younger-good health  Social History Cheri Kearns; 2018/03/13 2:04 PM) Current tobacco use  Former smoker. quit in 1992, smoked for 30 yrs 3 ppd Non Drinker/No Alcohol Use  Marital status  Single. Living Situation  Lives with relatives. grandson lives in home with pt Number of Children  1.  Past Surgical History Cheri Kearns; 03-13-2018 2:04 PM) Knee Replacement, Total [2014]: Bilateral. (4) total Colon Removal - Partial [2016]:  Medication History Frances Furbish Johnson; 13-Mar-2018 2:16 PM) Denta 5000 Plus (1.1% Cream, Dental daily) Active. Chlorhexidine Gluconate (0.12% Solution, Mouth/Throat every other day) Active. Viagra (100MG Tablet, Oral prn) Active. Bydureon BCise (2MG/0.85ML Auto-injector, Subcutaneous weekly) Active. Sertraline HCl (100MG Tablet, 1 Oral daily) Active. Atorvastatin Calcium (20MG Tablet, 1 Oral daily) Active. metFORMIN HCl (1000MG Tablet, 1 Oral two times daily) Active. Jardiance (25MG Tablet, 1 Oral daily) Active. ALPRAZolam (2MG Tablet, Oral prn) Active. Pioglitazone HCl (45MG Tablet, 1 Oral daily) Active. Levemir FlexTouch (100UNIT/ML Soln Pen-inj, 60units Subcutaneous daily) Active. HumaLOG KwikPen (200UNIT/ML Soln Pen-inj, 10units Subcutaneous three times daily) Active. Zolpidem Tartrate (10MG Tablet, Oral prn) Active. Metoprolol Tartrate (50MG Tablet, 1 (one) Tablet Oral two times daily, Taken starting 11/18/2017) Active. Lisinopril (20MG Tablet, 1 Oral daily) Active. CeleBREX (200MG Capsule, 1 Oral daily) Active. oxyCODONE-Acetaminophen (10-325MG Tablet, Oral prn) Active. Medications Reconciled (list present)  Diagnostic Studies History Cheri Kearns; 2018/03/13 2:05 PM) Echocardiogram  10/08/2017: Left ventricle cavity is normal in size. Severe concentric hypertrophy of the  left ventricle. Mild decrease in global wall motion. Indeterminate diastolic filling pattern. Calculated EF 50%. Left atrial cavity is moderately dilated. Inadequate tricuspid regurgitation jet to estimate pulmonary artery pressure IVC is dilated with blunted respiratory response. This suggests elevated central venous pressure. Coronary Angiogram [1992]: Nuclear stress test  Lexiscan myoview stress test 09/23/2017: 1. Lexiscan stress test was performed. Exercise capacity was not assessed. Normal blood pressure. No stress symptoms reported. The resting electrocardiogram demonstrated normal sinus rhythm, normal resting conduction, frequent PVC's and normal rest repolarization. Stress EKG is non diagnostic for ischemia as it is a pharmacologic stress. 2. The overall quality of the study is good. Left ventricular cavity is noted to be normal on the rest and stress studies. Gated SPECT imaging demonstrates hypokinesis of the basal inferolateral, mid inferolateral and apical lateral myocardial wall(s). The left ventricular ejection fraction was calculated or visually estimated to be 24%. SPECT images demonstrate large perfusion abnormality of moderate intensity in the basal inferior, basal inferolateral, mid inferior, mid inferolateral and apical lateral myocardial wall(s) on the stress and rest images without significant reversibility. Findings suggest large area of infarct in LCx/PDA territory without significant ischemia. 3. High risk study Colonoscopy [2016]: Holter Monitor  8 hour; 10/16/2017-10/18/2017: Temperature heart rate 75 bpm. Total ventricular ectopic beats 15,924. Ventricular burdened 16.2%. 1833 bigeminy. 13,329 of single PVCs. Longest bradycardia episode at 54 bpm occurred on day 2 at 1:49 AM. He had 4 minutes of sinus tachycardia with single fastest episode occurring on day 2 at 2:17 PM with maximum heart rate of 126 bpm. No A. fib occurred. One reported symptom of shortness of breath more than  usual occurred Day 1 at 3:15 PM, correlated rhythm revealed artifact. Treadmill stress test  about 25 ago    Review of Systems Gwinda Maine FNP-C; 02/25/2018 9:04 PM) General  Present- Fatigue and Tiredness (constant throughout the day. Does not sleep well at night due to frequent urination). Not Present- Appetite Loss and Weight Gain. Respiratory Present- Snoring. Not Present- Chronic Cough and Wakes up from Sleep Wheezing or Short of Breath. Cardiovascular Present- Claudications (right thigh; present for several years and unchanged) and Difficulty Breathing On Exertion (worsening). Not Present- Chest Pain, Difficulty Breathing Lying Down and Edema. Gastrointestinal Not Present- Black, Tarry Stool and Difficulty Swallowing. Musculoskeletal Not Present- Decreased Range of Motion and Muscle Atrophy. Neurological Not Present- Attention Deficit. Psychiatric Not Present- Personality Changes and Suicidal Ideation. Endocrine Not Present- Cold Intolerance and Heat Intolerance. Hematology Not Present- Abnormal Bleeding. All other systems negative  Vitals Frances Furbish Johnson; 02/24/2018 2:17 PM) 02/24/2018 2:07 PM Weight: 320 lb Height: 72in Body Surface Area: 2.6 m Body Mass Index: 43.4 kg/m  Pulse: 76 (Regular)  P.OX: 97% (Room air) BP: 137/70 (Sitting, Left Arm, Standard)  Physical Exam Gwinda Maine, FNP-C; 02/25/2018 9:4 PM) General Mental Status-Alert. General Appearance-Cooperative and Appears stated age. Build & Nutrition-Moderately built and Morbidly obese.  Head and Neck Thyroid Gland Characteristics - normal size and consistency and no palpable nodules.  Chest and Lung Exam Chest and lung exam reveals -quiet, even and easy respiratory effort with no use of accessory muscles, non-tender and on auscultation, normal breath sounds, no adventitious sounds.  Cardiovascular Cardiovascular examination reveals -normal heart sounds, regular rate and rhythm with  no murmurs, carotid auscultation reveals no bruits and abdominal aorta auscultation reveals no bruits and no prominent pulsation.  Abdomen Inspection Contour - Obese. Palpation/Percussion Normal exam - Non Tender and No hepatosplenomegaly.  Peripheral Vascular Lower Extremity Palpation - Femoral pulse - Bilateral - Normal. Popliteal pulse - Bilateral - Normal. Dorsalis pedis pulse - Left - 2+. Right - 1+. Posterior tibial pulse - Bilateral - Normal.  Neurologic Neurologic evaluation reveals -alert and oriented x 3 with no impairment of recent or remote memory. Motor-Grossly intact without any focal deficits.  Musculoskeletal Global Assessment Left Lower Extremity - no deformities, masses or tenderness, no known fractures. Right Lower Extremity - no deformities, masses or tenderness, no known fractures.    Assessment & Plan Gwinda Maine FNP-C; 02/25/2018 9:00 PM) Coronary artery disease of native artery of native heart with stable angina pectoris (I25.118) Story: S/P angioplasty by Dr. Glade Lloyd in 1990's  EKGs 11/18/2017: Normal sinus rhythm at 72 bpm with left atrial enlargement and first-degree AV block, one PAC, left axis deviation and poor R-wave progression cannot exclude anterior infarct old. No evidence of ischemia. Compared to previous EKG, frequent PVC have resolved Impression: Lexiscan myoview stress test 09/23/2017: 1. Lexiscan stress test was performed. Exercise capacity was not assessed. Normal blood pressure. No stress symptoms reported. The resting electrocardiogram demonstrated normal sinus rhythm, normal resting conduction, frequent PVC's and normal rest repolarization. Stress EKG is non diagnostic for ischemia as it is a pharmacologic stress. 2. The overall quality of the study is good. Left ventricular cavity is noted to be normal on the rest and stress studies. Gated SPECT imaging demonstrates hypokinesis of the basal inferolateral, mid inferolateral and apical  lateral myocardial wall(s). The left ventricular ejection fraction was calculated or visually estimated to be 24%. SPECT images demonstrate large perfusion abnormality of moderate intensity in the basal inferior, basal inferolateral, mid inferior, mid inferolateral and apical lateral myocardial wall(s) on the stress and rest images without significant reversibility. Findings suggest large area of infarct in LCx/PDA territory without significant ischemia. 3. High risk study.  Current Plans METABOLIC PANEL, BASIC (67341) CBC & PLATELETS (AUTO) (93790) Frequent PVCs (I49.3) Story: 48 hour Holter monitor 10/16/2017-10/18/2017: Resting heart rate 75 bpm. Total ventricular ectopic beats 15,924. Ventricular burdened 16.2%. 1833 bigeminy. 13,329 of single PVCs. Longest bradycardia episode at 54 bpm occurred on day 2 at 1:49 AM. He had 4 minutes of sinus tachycardia with single fastest episode occurring on day 2 at 2:17 PM with maximum heart rate of 126 bpm. No A. fib occurred. One reported symptom of shortness of breath more than usual occurred Day 1 at 3:15 PM, correlated rhythm revealed artifact. Uncontrolled type 2 diabetes mellitus without complication, with long-term current use of insulin (E11.65) Dyspnea on exertion (R06.09) Story: Echocardiogram 10/08/2017: Left ventricle cavity is normal in size. Severe concentric hypertrophy of the left ventricle. Mild decrease in global wall motion. Indeterminate diastolic filling pattern. Calculated EF 50%. Left atrial cavity is moderately dilated. Inadequate tricuspid regurgitation jet to estimate pulmonary artery pressure IVC is dilated with blunted respiratory response. This suggests elevated central venous pressure. Benign essential hypertension (I10) Story: EKG 09/12/2017: Sinus rhythm w/frequent PVC's and intermittent bigeminy. Possible old inferior infarct. Poor R wave progression Morbid obesity (E66.01) Laboratory examination (W40.97)  Story: 02/24/2018:  Glucose 247, creatinine 1.01, EGFR 76, potassium 4.6, BMP otherwise normal.  CBC normal.  09/02/2017: Creatinine 1.1, potassium 4.7, EGFR 73/89, CMP normal. Hemoglobin A1c 9%. Cholesterol 148, triglycerides 120, HDL 39, LDL 85.  Note:. Recommendation:  Mr. Loera is a 67 year old Caucasian male who recently established with PCP Dr. Maudie Mercury, has past medical history of CAD status post PCI in 1992, hypertension, hyperlipidemia, former tobacco use, and uncontrolled type 2 diabetes. Found to have asymptomatic frequent PVCs that have resolved with metoprolol 42m BID. He now presents for 3 month follow up.  Since last seen [place] he's had significant progression of dyspnea on exertion and is now having symptoms on minimal exertion. Although I suspect his obesity with hypoventilation is contributing to his dyspnea on exertion, cannot exclude progression of CAD given his stress test findings. I recommended that he proceed with coronary angiogram for further evaluation. Schedule for cardiac catheterization, and possible angioplasty. We discussed regarding risks, benefits, alternatives to this including stress testing, CTA and continued medical therapy. Patient wants to proceed. Understands <1-2% risk of death, stroke, MI, urgent CABG, bleeding, infection, renal failure but not limited to these.  He has gained 20 pounds since seen by [place] ago. No clinical evidence of heart failure. I have extensively discussed diet modifications including weight loss. Exercise is difficult for him due to bilateral osteoarthritic pain in his knees. Blood pressure remained stable. No changes were made in medications today. I'll see him back after procedure for further recommendations and evaluation[facility]  [facility] [place] I feel he will need to have continued frequent follow ups, [facility] He does wish to have coronary angiogram performed [place] with [place] does not want to delay procedure due to his symptoms. He will [place] after the procedure and then plan to establish with a new cardiologist after that.  CC: Dr. JJani Gravel Signed by AGwinda Maine FNP-C (02/25/2018 9:04 PM)

## 2018-03-11 ENCOUNTER — Encounter (HOSPITAL_COMMUNITY): Payer: Self-pay | Admitting: General Practice

## 2018-03-11 ENCOUNTER — Other Ambulatory Visit: Payer: Self-pay

## 2018-03-11 ENCOUNTER — Ambulatory Visit (HOSPITAL_COMMUNITY)
Admission: RE | Disposition: A | Discharge status: Home / Self Care | Payer: Self-pay | Source: Ambulatory Visit | Attending: Cardiology

## 2018-03-11 ENCOUNTER — Ambulatory Visit (HOSPITAL_COMMUNITY)
Admission: RE | Admit: 2018-03-11 | Discharge: 2018-03-12 | Disposition: A | Discharge status: Home / Self Care | Payer: Medicare Other | Source: Ambulatory Visit | Attending: Cardiology | Admitting: Cardiology

## 2018-03-11 DIAGNOSIS — E785 Hyperlipidemia, unspecified: Secondary | ICD-10-CM | POA: Diagnosis not present

## 2018-03-11 DIAGNOSIS — R0609 Other forms of dyspnea: Secondary | ICD-10-CM | POA: Diagnosis not present

## 2018-03-11 DIAGNOSIS — Z96653 Presence of artificial knee joint, bilateral: Secondary | ICD-10-CM | POA: Diagnosis not present

## 2018-03-11 DIAGNOSIS — I10 Essential (primary) hypertension: Secondary | ICD-10-CM | POA: Insufficient documentation

## 2018-03-11 DIAGNOSIS — Z9861 Coronary angioplasty status: Secondary | ICD-10-CM

## 2018-03-11 DIAGNOSIS — Z794 Long term (current) use of insulin: Secondary | ICD-10-CM | POA: Diagnosis not present

## 2018-03-11 DIAGNOSIS — I25118 Atherosclerotic heart disease of native coronary artery with other forms of angina pectoris: Secondary | ICD-10-CM | POA: Insufficient documentation

## 2018-03-11 DIAGNOSIS — Z87891 Personal history of nicotine dependence: Secondary | ICD-10-CM | POA: Diagnosis not present

## 2018-03-11 DIAGNOSIS — Z79899 Other long term (current) drug therapy: Secondary | ICD-10-CM | POA: Insufficient documentation

## 2018-03-11 DIAGNOSIS — Z8249 Family history of ischemic heart disease and other diseases of the circulatory system: Secondary | ICD-10-CM | POA: Insufficient documentation

## 2018-03-11 DIAGNOSIS — E1165 Type 2 diabetes mellitus with hyperglycemia: Secondary | ICD-10-CM | POA: Insufficient documentation

## 2018-03-11 DIAGNOSIS — Z6841 Body Mass Index (BMI) 40.0 and over, adult: Secondary | ICD-10-CM | POA: Diagnosis not present

## 2018-03-11 DIAGNOSIS — Z9889 Other specified postprocedural states: Secondary | ICD-10-CM | POA: Insufficient documentation

## 2018-03-11 DIAGNOSIS — I2584 Coronary atherosclerosis due to calcified coronary lesion: Secondary | ICD-10-CM | POA: Insufficient documentation

## 2018-03-11 DIAGNOSIS — I2510 Atherosclerotic heart disease of native coronary artery without angina pectoris: Secondary | ICD-10-CM | POA: Diagnosis present

## 2018-03-11 DIAGNOSIS — I493 Ventricular premature depolarization: Secondary | ICD-10-CM | POA: Insufficient documentation

## 2018-03-11 DIAGNOSIS — Z955 Presence of coronary angioplasty implant and graft: Secondary | ICD-10-CM

## 2018-03-11 DIAGNOSIS — R0602 Shortness of breath: Secondary | ICD-10-CM | POA: Diagnosis not present

## 2018-03-11 HISTORY — DX: Type 2 diabetes mellitus without complications: E11.9

## 2018-03-11 HISTORY — PX: RIGHT/LEFT HEART CATH AND CORONARY ANGIOGRAPHY: CATH118266

## 2018-03-11 HISTORY — DX: Pure hypercholesterolemia, unspecified: E78.00

## 2018-03-11 HISTORY — PX: CORONARY STENT INTERVENTION: CATH118234

## 2018-03-11 HISTORY — DX: Personal history of urinary calculi: Z87.442

## 2018-03-11 HISTORY — PX: CORONARY ATHERECTOMY: CATH118238

## 2018-03-11 HISTORY — DX: Coronary angioplasty status: Z98.61

## 2018-03-11 HISTORY — DX: Acute myocardial infarction, unspecified: I21.9

## 2018-03-11 LAB — POCT I-STAT 3, ART BLOOD GAS (G3+)
Acid-base deficit: 1 mmol/L (ref 0.0–2.0)
Bicarbonate: 25.8 mmol/L (ref 20.0–28.0)
O2 Saturation: 84 %
PH ART: 7.306 — AB (ref 7.350–7.450)
TCO2: 27 mmol/L (ref 22–32)
pCO2 arterial: 51.7 mmHg — ABNORMAL HIGH (ref 32.0–48.0)
pO2, Arterial: 55 mmHg — ABNORMAL LOW (ref 83.0–108.0)

## 2018-03-11 LAB — POCT I-STAT 3, VENOUS BLOOD GAS (G3P V)
Acid-base deficit: 1 mmol/L (ref 0.0–2.0)
Acid-base deficit: 1 mmol/L (ref 0.0–2.0)
Bicarbonate: 26.2 mmol/L (ref 20.0–28.0)
Bicarbonate: 26.4 mmol/L (ref 20.0–28.0)
O2 SAT: 74 %
O2 SAT: 75 %
PCO2 VEN: 51.9 mmHg (ref 44.0–60.0)
TCO2: 28 mmol/L (ref 22–32)
TCO2: 28 mmol/L (ref 22–32)
pCO2, Ven: 51.6 mmHg (ref 44.0–60.0)
pH, Ven: 7.31 (ref 7.250–7.430)
pH, Ven: 7.316 (ref 7.250–7.430)
pO2, Ven: 44 mmHg (ref 32.0–45.0)
pO2, Ven: 44 mmHg (ref 32.0–45.0)

## 2018-03-11 LAB — POCT ACTIVATED CLOTTING TIME
ACTIVATED CLOTTING TIME: 285 s
ACTIVATED CLOTTING TIME: 180 s
ACTIVATED CLOTTING TIME: 153 s
Activated Clotting Time: 263 seconds

## 2018-03-11 LAB — GLUCOSE, CAPILLARY
Glucose-Capillary: 156 mg/dL — ABNORMAL HIGH (ref 70–99)
Glucose-Capillary: 183 mg/dL — ABNORMAL HIGH (ref 70–99)

## 2018-03-11 SURGERY — RIGHT/LEFT HEART CATH AND CORONARY ANGIOGRAPHY
Anesthesia: LOCAL

## 2018-03-11 MED ORDER — CLOPIDOGREL BISULFATE 300 MG PO TABS
ORAL_TABLET | ORAL | Status: DC | PRN
  Administered 2018-03-11: 600 mg via ORAL

## 2018-03-11 MED ORDER — SODIUM CHLORIDE 0.9% FLUSH
3.0000 mL | Freq: Two times a day (BID) | INTRAVENOUS | Status: DC
  Administered 2018-03-12: 02:00:00 3 mL via INTRAVENOUS

## 2018-03-11 MED ORDER — THE SENSUOUS HEART BOOK
Freq: Once | Status: AC
  Administered 2018-03-11: 22:00:00
  Filled 2018-03-11: qty 1

## 2018-03-11 MED ORDER — SERTRALINE HCL 50 MG PO TABS
100.0000 mg | ORAL_TABLET | Freq: Every day | ORAL | Status: DC
  Administered 2018-03-11 – 2018-03-12 (×2): 100 mg via ORAL
  Filled 2018-03-11 (×2): qty 2

## 2018-03-11 MED ORDER — LIDOCAINE HCL (PF) 1 % IJ SOLN
INTRAMUSCULAR | Status: DC | PRN
  Administered 2018-03-11 (×2): 2 mL via INTRADERMAL

## 2018-03-11 MED ORDER — FENTANYL CITRATE (PF) 100 MCG/2ML IJ SOLN
INTRAMUSCULAR | Status: AC
  Filled 2018-03-11: qty 2

## 2018-03-11 MED ORDER — INSULIN LISPRO 200 UNIT/ML ~~LOC~~ SOPN: 10.0000 [IU] | PEN_INJECTOR | Freq: Three times a day (TID) | SUBCUTANEOUS | Status: DC

## 2018-03-11 MED ORDER — IOHEXOL 350 MG/ML SOLN
INTRAVENOUS | Status: DC | PRN
  Administered 2018-03-11: 275 mL via INTRAVENOUS

## 2018-03-11 MED ORDER — ACETAMINOPHEN 325 MG PO TABS: 650.0000 mg | ORAL_TABLET | ORAL | Status: DC | PRN

## 2018-03-11 MED ORDER — SODIUM CHLORIDE 0.9 % IV SOLN: 250.0000 mL | INTRAVENOUS | Status: DC | PRN

## 2018-03-11 MED ORDER — ANGIOPLASTY BOOK
Freq: Once | Status: AC
  Administered 2018-03-11: 22:00:00
  Filled 2018-03-11: qty 1

## 2018-03-11 MED ORDER — VERAPAMIL HCL 2.5 MG/ML IV SOLN
INTRA_ARTERIAL | Status: DC | PRN
  Administered 2018-03-11: 10 mL via INTRA_ARTERIAL

## 2018-03-11 MED ORDER — MIDAZOLAM HCL 2 MG/2ML IJ SOLN
INTRAMUSCULAR | Status: AC
  Filled 2018-03-11: qty 2

## 2018-03-11 MED ORDER — SODIUM CHLORIDE 0.9 % IV SOLN
INTRAVENOUS | Status: DC
  Administered 2018-03-11: 10:00:00 via INTRAVENOUS

## 2018-03-11 MED ORDER — INSULIN ASPART 100 UNIT/ML ~~LOC~~ SOLN: 0.0000 [IU] | Freq: Every day | SUBCUTANEOUS | Status: DC

## 2018-03-11 MED ORDER — METOPROLOL TARTRATE 25 MG PO TABS
50.0000 mg | ORAL_TABLET | Freq: Two times a day (BID) | ORAL | Status: DC
  Administered 2018-03-11 – 2018-03-12 (×2): 50 mg via ORAL
  Filled 2018-03-11 (×3): qty 2

## 2018-03-11 MED ORDER — HEPARIN (PORCINE) IN NACL 1000-0.9 UT/500ML-% IV SOLN
INTRAVENOUS | Status: DC | PRN
  Administered 2018-03-11 (×2): 500 mL

## 2018-03-11 MED ORDER — LIDOCAINE HCL (PF) 1 % IJ SOLN
INTRAMUSCULAR | Status: AC
  Filled 2018-03-11: qty 30

## 2018-03-11 MED ORDER — MIDAZOLAM HCL 2 MG/2ML IJ SOLN
INTRAMUSCULAR | Status: DC | PRN
  Administered 2018-03-11 (×2): 1 mg via INTRAVENOUS
  Administered 2018-03-11: 2 mg via INTRAVENOUS

## 2018-03-11 MED ORDER — SODIUM CHLORIDE 0.9% FLUSH: 3.0000 mL | Freq: Two times a day (BID) | INTRAVENOUS | Status: DC

## 2018-03-11 MED ORDER — ATORVASTATIN CALCIUM 20 MG PO TABS
20.0000 mg | ORAL_TABLET | Freq: Every evening | ORAL | Status: DC
  Administered 2018-03-11: 17:00:00 20 mg via ORAL
  Filled 2018-03-11: qty 1

## 2018-03-11 MED ORDER — FENTANYL CITRATE (PF) 100 MCG/2ML IJ SOLN
INTRAMUSCULAR | Status: DC | PRN
  Administered 2018-03-11: 50 ug via INTRAVENOUS
  Administered 2018-03-11 (×2): 25 ug via INTRAVENOUS
  Administered 2018-03-11: 50 ug via INTRAVENOUS
  Administered 2018-03-11 (×2): 25 ug via INTRAVENOUS

## 2018-03-11 MED ORDER — EXENATIDE ER 2 MG/0.85ML ~~LOC~~ AUIJ: 2.0000 mg | AUTO-INJECTOR | SUBCUTANEOUS | Status: DC

## 2018-03-11 MED ORDER — CANAGLIFLOZIN 100 MG PO TABS
100.0000 mg | ORAL_TABLET | Freq: Every day | ORAL | Status: DC
  Administered 2018-03-12: 100 mg via ORAL
  Filled 2018-03-11: qty 1

## 2018-03-11 MED ORDER — INSULIN DETEMIR 100 UNIT/ML FLEXPEN: 60.0000 [IU] | PEN_INJECTOR | Freq: Every evening | SUBCUTANEOUS | Status: DC

## 2018-03-11 MED ORDER — POLYVINYL ALCOHOL 1.4 % OP SOLN
1.0000 [drp] | OPHTHALMIC | Status: DC | PRN
  Filled 2018-03-11: qty 15

## 2018-03-11 MED ORDER — INSULIN ASPART 100 UNIT/ML ~~LOC~~ SOLN: 0.0000 [IU] | Freq: Three times a day (TID) | SUBCUTANEOUS | Status: DC

## 2018-03-11 MED ORDER — SODIUM CHLORIDE 0.9 % WEIGHT BASED INFUSION
1.0000 mL/kg/h | INTRAVENOUS | Status: AC
  Administered 2018-03-11: 17:00:00 1 mL/kg/h via INTRAVENOUS

## 2018-03-11 MED ORDER — NITROGLYCERIN 1 MG/10 ML FOR IR/CATH LAB
INTRA_ARTERIAL | Status: AC
  Filled 2018-03-11: qty 10

## 2018-03-11 MED ORDER — ASPIRIN 81 MG PO CHEW
81.0000 mg | CHEWABLE_TABLET | Freq: Every day | ORAL | Status: DC
  Administered 2018-03-12: 10:00:00 81 mg via ORAL
  Filled 2018-03-11: qty 1

## 2018-03-11 MED ORDER — ZOLPIDEM TARTRATE 5 MG PO TABS: 5.0000 mg | ORAL_TABLET | Freq: Every evening | ORAL | Status: DC | PRN

## 2018-03-11 MED ORDER — VERAPAMIL HCL 2.5 MG/ML IV SOLN
INTRAVENOUS | Status: AC
  Filled 2018-03-11: qty 2

## 2018-03-11 MED ORDER — HEPARIN (PORCINE) IN NACL 1000-0.9 UT/500ML-% IV SOLN
INTRAVENOUS | Status: AC
  Filled 2018-03-11: qty 1500

## 2018-03-11 MED ORDER — NITROGLYCERIN 1 MG/10 ML FOR IR/CATH LAB
INTRA_ARTERIAL | Status: DC | PRN
  Administered 2018-03-11: 300 mL via INTRACORONARY

## 2018-03-11 MED ORDER — VIPERSLIDE LUBRICANT OPTIME
TOPICAL | Status: DC | PRN
  Administered 2018-03-11: 20 mL via SURGICAL_CAVITY

## 2018-03-11 MED ORDER — SODIUM CHLORIDE 0.9% FLUSH: 3.0000 mL | INTRAVENOUS | Status: DC | PRN

## 2018-03-11 MED ORDER — CLOPIDOGREL BISULFATE 300 MG PO TABS
ORAL_TABLET | ORAL | Status: AC
  Filled 2018-03-11: qty 2

## 2018-03-11 MED ORDER — OXYCODONE-ACETAMINOPHEN 10-325 MG PO TABS: 1.0000 | ORAL_TABLET | ORAL | Status: DC | PRN

## 2018-03-11 MED ORDER — CLOPIDOGREL BISULFATE 75 MG PO TABS
75.0000 mg | ORAL_TABLET | Freq: Every day | ORAL | Status: DC
  Administered 2018-03-12: 10:00:00 75 mg via ORAL
  Filled 2018-03-11: qty 1

## 2018-03-11 MED ORDER — OXYCODONE-ACETAMINOPHEN 5-325 MG PO TABS: 1.0000 | ORAL_TABLET | ORAL | Status: DC | PRN

## 2018-03-11 MED ORDER — HEPARIN SODIUM (PORCINE) 1000 UNIT/ML IJ SOLN
INTRAMUSCULAR | Status: DC | PRN
  Administered 2018-03-11: 3000 [IU] via INTRAVENOUS
  Administered 2018-03-11: 5500 [IU] via INTRAVENOUS
  Administered 2018-03-11: 3000 [IU] via INTRAVENOUS
  Administered 2018-03-11: 8000 [IU] via INTRAVENOUS

## 2018-03-11 MED ORDER — ONDANSETRON HCL 4 MG/2ML IJ SOLN: 4.0000 mg | Freq: Four times a day (QID) | INTRAMUSCULAR | Status: DC | PRN

## 2018-03-11 MED ORDER — HYDRALAZINE HCL 20 MG/ML IJ SOLN: 5.0000 mg | INTRAMUSCULAR | Status: AC | PRN

## 2018-03-11 MED ORDER — POLYETHYL GLYCOL-PROPYL GLYCOL 0.4-0.3 % OP SOLN: 1.0000 [drp] | Freq: Three times a day (TID) | OPHTHALMIC | Status: DC

## 2018-03-11 MED ORDER — SODIUM CHLORIDE 0.9% FLUSH
3.0000 mL | INTRAVENOUS | Status: DC | PRN
  Administered 2018-03-11: 17:00:00 3 mL via INTRAVENOUS
  Filled 2018-03-11: qty 3

## 2018-03-11 MED ORDER — LISINOPRIL 10 MG PO TABS
20.0000 mg | ORAL_TABLET | Freq: Every day | ORAL | Status: DC
  Administered 2018-03-11 – 2018-03-12 (×2): 20 mg via ORAL
  Filled 2018-03-11 (×2): qty 2

## 2018-03-11 MED ORDER — PIOGLITAZONE HCL 45 MG PO TABS: 45.0000 mg | ORAL_TABLET | Freq: Every evening | ORAL | Status: DC

## 2018-03-11 MED ORDER — ALPRAZOLAM 0.5 MG PO TABS
2.0000 mg | ORAL_TABLET | Freq: Four times a day (QID) | ORAL | Status: DC | PRN
  Filled 2018-03-11: qty 4

## 2018-03-11 MED ORDER — ASPIRIN 81 MG PO CHEW
81.0000 mg | CHEWABLE_TABLET | Freq: Once | ORAL | Status: AC
  Administered 2018-03-11: 81 mg via ORAL
  Filled 2018-03-11: qty 1

## 2018-03-11 MED ORDER — INSULIN DETEMIR 100 UNIT/ML ~~LOC~~ SOLN
60.0000 [IU] | Freq: Every evening | SUBCUTANEOUS | Status: DC
  Administered 2018-03-11: 17:00:00 60 [IU] via SUBCUTANEOUS
  Filled 2018-03-11 (×3): qty 0.6

## 2018-03-11 MED ORDER — OXYCODONE HCL 5 MG PO TABS: 5.0000 mg | ORAL_TABLET | ORAL | Status: DC | PRN

## 2018-03-11 SURGICAL SUPPLY — 28 items
BALLN SAPPHIRE 2.0X12 (BALLOONS) ×2
BALLN SAPPHIRE 3.0X15 (BALLOONS) ×2
BALLN SAPPHIRE ~~LOC~~ 3.5X15 (BALLOONS) ×1 IMPLANT
BALLN WOLVERINE 3.00X10 (BALLOONS) ×2
BALLOON SAPPHIRE 2.0X12 (BALLOONS) IMPLANT
BALLOON SAPPHIRE 3.0X15 (BALLOONS) IMPLANT
BALLOON WOLVERINE 3.00X10 (BALLOONS) IMPLANT
CATH BALLN WEDGE 5F 110CM (CATHETERS) ×1 IMPLANT
CATH OPTITORQUE TIG 4.0 5F (CATHETERS) ×1 IMPLANT
CATH TELEPORT (CATHETERS) ×1 IMPLANT
CATH TRAPPER 6-8F (CATHETERS) ×2 IMPLANT
CATH VISTA GUIDE 6FR XB3.5 (CATHETERS) ×2 IMPLANT
CROWN DIAMONDBACK CLASSIC 1.25 (BURR) ×1 IMPLANT
DEVICE RAD COMP TR BAND LRG (VASCULAR PRODUCTS) ×1 IMPLANT
GLIDESHEATH SLEND A-KIT 6F 20G (SHEATH) ×2 IMPLANT
GUIDEWIRE INQWIRE 1.5J.035X260 (WIRE) ×1 IMPLANT
INQWIRE 1.5J .035X260CM (WIRE) ×2
KIT ENCORE 26 ADVANTAGE (KITS) ×2 IMPLANT
KIT HEART LEFT (KITS) ×2 IMPLANT
LUBRICANT VIPERSLIDE CORONARY (MISCELLANEOUS) ×1 IMPLANT
PACK CARDIAC CATHETERIZATION (CUSTOM PROCEDURE TRAY) ×2 IMPLANT
SHEATH GLIDE SLENDER 4/5FR (SHEATH) ×1 IMPLANT
STENT ORSIRO 3.5X22 (Permanent Stent) ×1 IMPLANT
TRANSDUCER W/STOPCOCK (MISCELLANEOUS) ×2 IMPLANT
TUBING CIL FLEX 10 FLL-RA (TUBING) ×2 IMPLANT
WIRE ASAHI MIRACLEBROS-3 180CM (WIRE) ×2 IMPLANT
WIRE COUGAR XT STRL 190CM (WIRE) ×2 IMPLANT
WIRE VIPER ADVANCE COR .012TIP (WIRE) ×2 IMPLANT

## 2018-03-11 NOTE — Interval H&P Note (Signed)
History and Physical Interval Note:  03/11/2018 12:00 PM  Modena Nunnery  has presented today for surgery, with the diagnosis of positive stress - sob  The various methods of treatment have been discussed with the patient and family. After consideration of risks, benefits and other options for treatment, the patient has consented to  Procedure(s): RIGHT/LEFT HEART CATH AND CORONARY ANGIOGRAPHY (N/A) and possible angioplasty as a surgical intervention .  The patient's history has been reviewed, patient examined, no change in status, stable for surgery.  I have reviewed the patient's chart and labs.  Questions were answered to the patient's satisfaction.   Symptom Status: Ischemic Symptoms Non-invasive Testing: High risk If no or indeterminate stress test, FFR/iFR results in all diseased vessels: N/A Diabetes Mellitus: Yes S/P CABG: No Antianginal therapy (number of long-acting drugs): 1 Patient undergoing renal transplant: No Patient undergoing percutaneous valve procedure: No   1 Vessel Disease No proximal LAD involvement, No proximal left dominant LCX involvement  PCI: M (6);  Indication 2  CABG: M (4);  Indication 2 Proximal left dominant LCX involvement  PCI: A (7);  Indication 5  CABG: A (7);  Indication 5 Proximal LAD involvement  PCI: A (7);  Indication 5  CABG: A (7);  Indication 5  2 Vessel Disease No proximal LAD involvement  PCI: A (7);  Indication 8  CABG: M (6);  Indication 8 Proximal LAD involvement  PCI: A (7);  Indication 14  CABG: A (8);  Indication 14  3 Vessel Disease Low disease complexity (e.g., focal stenoses, SYNTAX <=22)  PCI: A (7);  Indication 19  CABG: A (8);  Indication 19 Intermediate or high disease complexity (e.g., SYNTAX >=23)  PCI: M (5);  Indication 23  CABG: A (8);  Indication 23  Left Main Disease Isolated LMCA disease: ostial or midshaft  PCI: A (7);  Indication 24  CABG: A (9);  Indication 24 Isolated LMCA disease: bifurcation  involvement  PCI: M (5);  Indication 25  CABG: A (9);  Indication 25 LMCA ostial or midshaft, concurrent low disease burden multivessel disease (e.g., 1-2 additional focal stenoses, SYNTAX <=22)  PCI: A (7);  Indication 26  CABG: A (9);  Indication 26 LMCA ostial or midshaft, concurrent intermediate or high disease burden multivessel disease (e.g., 1-2 additional bifurcation stenoses, long stenoses, SYNTAX >=23)  PCI: M (4);  Indication 27  CABG: A (9);  Indication 27 LMCA bifurcation involvement, concurrent low disease burden multivessel disease (e.g., 1-2 additional focal stenoses, SYNTAX <=22)  PCI: M (5);  Indication 28  CABG: A (9);  Indication 28 LMCA bifurcation involvement, concurrent intermediate or high disease burden multivessel disease (e.g., 1-2 additional bifurcation stenoses, long stenoses, SYNTAX >=23)  PCI: R (3);  Indication 29  CABG: A (9);  Indication 29  Notes:  A indicates appropriate. M indicates may be appropriate. R indicates rarely appropriate. Number in parentheses is median score for that indication. Reclassify indicates number of functionally diseased vessels should be decreased given negative FFR/iFR. Re-evaluate the scenario interpreting any FFR/iFR negative vessel as being not significantly stenosed.  Disease means involved vessel provides flow to a sufficient amount of myocardium to be clinically important.  If FFR testing indicates a vessel is not significant, that vessel should not be considered diseased (and the patient should be reclassified with respect to extent of functionally significant disease).  Proximal LAD + proximal left dominant LCX is considered 3 vessel CAD  2 Vessel CAD with FFR/iFR abnormal in only 1  but not both is considered 1 vessel CAD  Disease complexity includes occlusion, bifurcation, trifurcation, ostial, >20 mm, tortuosity, calcification, thrombus  LMCA disease is >=50% by angiography, MLD <2.8 mm, MLA <6 mm2; MLA 6-7.5 mm2 requires  further physiologic  See Table B for risk stratification based on noninvasive testing  Journal of the [employer] of Cardiology Mar 2017, 23391; DOI: 10.1016/j.jacc.2017.02.001 [email or web address].full-text.pdf This App  2018 by the Society for Cardiovascular Angiography and Interventions   Adrian Prows

## 2018-03-11 NOTE — Progress Notes (Signed)
Site area: right brachial  Site Prior to Removal:  Level 0  Pressure Applied For 10 MINUTES    Minutes Beginning at 1900  Manual:   Yes.    Patient Status During Pull:  wnl  Post Pull Groin Site:  Level 0  Post Pull Instructions Given:  Yes.    Post Pull Pulses Present:  Yes.    Dressing Applied:  Yes.    Comments:  Tolerated procedure well

## 2018-03-12 ENCOUNTER — Encounter (HOSPITAL_COMMUNITY): Payer: Self-pay | Admitting: Cardiology

## 2018-03-12 DIAGNOSIS — E785 Hyperlipidemia, unspecified: Secondary | ICD-10-CM | POA: Diagnosis not present

## 2018-03-12 DIAGNOSIS — Z794 Long term (current) use of insulin: Secondary | ICD-10-CM | POA: Diagnosis not present

## 2018-03-12 DIAGNOSIS — I25118 Atherosclerotic heart disease of native coronary artery with other forms of angina pectoris: Secondary | ICD-10-CM | POA: Diagnosis not present

## 2018-03-12 DIAGNOSIS — Z79899 Other long term (current) drug therapy: Secondary | ICD-10-CM | POA: Diagnosis not present

## 2018-03-12 DIAGNOSIS — Z6841 Body Mass Index (BMI) 40.0 and over, adult: Secondary | ICD-10-CM | POA: Diagnosis not present

## 2018-03-12 DIAGNOSIS — I2584 Coronary atherosclerosis due to calcified coronary lesion: Secondary | ICD-10-CM | POA: Diagnosis not present

## 2018-03-12 DIAGNOSIS — I493 Ventricular premature depolarization: Secondary | ICD-10-CM | POA: Diagnosis not present

## 2018-03-12 DIAGNOSIS — E1165 Type 2 diabetes mellitus with hyperglycemia: Secondary | ICD-10-CM | POA: Diagnosis not present

## 2018-03-12 DIAGNOSIS — R0609 Other forms of dyspnea: Secondary | ICD-10-CM | POA: Diagnosis not present

## 2018-03-12 DIAGNOSIS — Z87891 Personal history of nicotine dependence: Secondary | ICD-10-CM | POA: Diagnosis not present

## 2018-03-12 DIAGNOSIS — I10 Essential (primary) hypertension: Secondary | ICD-10-CM | POA: Diagnosis not present

## 2018-03-12 DIAGNOSIS — R0602 Shortness of breath: Secondary | ICD-10-CM | POA: Diagnosis not present

## 2018-03-12 LAB — BASIC METABOLIC PANEL WITH GFR
Anion gap: 5 (ref 5–15)
BUN: 17 mg/dL (ref 8–23)
CO2: 24 mmol/L (ref 22–32)
Calcium: 8.8 mg/dL — ABNORMAL LOW (ref 8.9–10.3)
Chloride: 112 mmol/L — ABNORMAL HIGH (ref 98–111)
Creatinine, Ser: 0.88 mg/dL (ref 0.61–1.24)
GFR calc Af Amer: >60 mL/min
GFR calc non Af Amer: >60 mL/min
Glucose, Bld: 108 mg/dL — ABNORMAL HIGH (ref 70–99)
Potassium: 4.8 mmol/L (ref 3.5–5.1)
Sodium: 141 mmol/L (ref 135–145)

## 2018-03-12 LAB — GLUCOSE, CAPILLARY
GLUCOSE-CAPILLARY: 111 mg/dL — AB (ref 70–99)
GLUCOSE-CAPILLARY: 109 mg/dL — AB (ref 70–99)

## 2018-03-12 LAB — CBC
HCT: 42.8 % (ref 39.0–52.0)
Hemoglobin: 13.7 g/dL (ref 13.0–17.0)
MCH: 31.5 pg (ref 26.0–34.0)
MCHC: 32 g/dL (ref 30.0–36.0)
MCV: 98.4 fL (ref 80.0–100.0)
NRBC: 0 % (ref 0.0–0.2)
Platelets: 184 10*3/uL (ref 150–400)
RBC: 4.35 MIL/uL (ref 4.22–5.81)
RDW: 14.4 % (ref 11.5–15.5)
WBC: 8.9 10*3/uL (ref 4.0–10.5)

## 2018-03-12 MED ORDER — ASPIRIN 81 MG PO CHEW: 81.0000 mg | CHEWABLE_TABLET | Freq: Every day | ORAL | 3 refills | Status: DC

## 2018-03-12 MED ORDER — CLOPIDOGREL BISULFATE 75 MG PO TABS: 75.0000 mg | ORAL_TABLET | Freq: Every day | ORAL | 3 refills | Status: DC

## 2018-03-12 MED FILL — Heparin Sod (Porcine)-NaCl IV Soln 1000 Unit/500ML-0.9%: INTRAVENOUS | Qty: 500 | Status: AC

## 2018-03-12 NOTE — Discharge Summary (Signed)
Physician Discharge Summary  Patient ID: Corey Romero MRN: [id number] DOB/AGE: 68/28/1952 68 y.o.  Admit date: 03/11/2018 Discharge date: 03/12/2018  Admission Diagnoses: Coronary artery disease Hypertension Type 2 DM Hyperlipidemia Morbid obesity   Discharge Diagnoses:  Principal Problem:   Coronary artery disease Active Problems:   Dyspnea on exertion   Post PTCA Hypertension Type 2 DM Hyperlipidemia Morbid obesity   Discharged Condition: stable  Hospital Course:   67 y/o Caucasian male with hypertension, type 2 DM, morbid obesity, hyperlipidemia, former tobacco abuse, CAD s/p prior PCI in 1992, now with exertional dyspnea and abnormal stress test. Patient underwent right and left heart cath on 03/11/18 that showed multivessel CAD, as described below. He underwent atherectomy and PCI to mid LAD, and attmempted PTCA to OM2. Patient did well overnight. Telemetry showed PAC's and PVC's but no Afib. He is being discharged on DAPT with aspirin/plavix. He will be referred to cardiac rehab.   Consults: None  Significant Diagnostic Studies:  Labs: Results for Corey, Romero (MRN [id number]) as of 03/12/2018 08:46  Ref. Range 03/12/2018 03:40  WBC Latest Ref Range: 4.0 - 10.5 K/uL 8.9  RBC Latest Ref Range: 4.22 - 5.81 MIL/uL 4.35  Hemoglobin Latest Ref Range: 13.0 - 17.0 g/dL 13.7  HCT Latest Ref Range: 39.0 - 52.0 % 42.8  MCV Latest Ref Range: 80.0 - 100.0 fL 98.4  MCH Latest Ref Range: 26.0 - 34.0 pg 31.5  MCHC Latest Ref Range: 30.0 - 36.0 g/dL 32.0  RDW Latest Ref Range: 11.5 - 15.5 % 14.4  Platelets Latest Ref Range: 150 - 400 K/uL 184  nRBC Latest Ref Range: 0.0 - 0.2 % 0.0   Results for Corey, Romero (MRN [id number]) as of 03/12/2018 08:46  Ref. Range 03/12/2018 93:23  BASIC METABOLIC PANEL Unknown Rpt (A)  Sodium Latest Ref Range: 135 - 145 mmol/L 141  Potassium Latest Ref Range: 3.5 - 5.1 mmol/L 4.8  Chloride Latest Ref Range: 98 - 111 mmol/L 112 (H)   CO2 Latest Ref Range: 22 - 32 mmol/L 24  Glucose Latest Ref Range: 70 - 99 mg/dL 108 (H)  BUN Latest Ref Range: 8 - 23 mg/dL 17  Creatinine Latest Ref Range: 0.61 - 1.24 mg/dL 0.88  Calcium Latest Ref Range: 8.9 - 10.3 mg/dL 8.8 (L)  Anion gap Latest Ref Range: 5 - 15  5  GFR, Est Non African American Latest Ref Range: >60 mL/min >60  GFR, Est African American Latest Ref Range: >60 mL/min >60    Treatments:  Right and left heart catheterization 03/11/2018: RA 9/6/mean 6 mmHg; RV 34/7, mean 12 mmHg; PA 36/13, mean 19 mmHg, PA saturation 75%.  PW 13/8, mean 8 mmHg.  Aortic saturation 99%.  Cardiac output 7.07, cardiac index 2.75 by Fick.  No evidence of pulmonary hypertension, preserved cardiac output and cardiac index.  Normal EDP.  LV: Mildly dilated, LVEF lower limit of normal with mild global hypokinesis.  EF 45 to 50%.  Right coronary artery is large and dominant.  Proximal segment has a 60 to 70% stenosis.  In some views appeared to be much more significant.  Large vessel. Left main: Short.  Mildly calcified. LAD: Large vessel, diffusely calcified, diffuse luminal irregularity, mid segment had a 90% highly calcified lesion after the origin of large D1.  S/P 3.5 x 22 mm Orsero DES, stenosis reduced to 0%. Circumflex: Circumflex continues as a large OM 2 which is occluded.  Small tiny OM1 comes off at the origin of  OM2.  235 mill contrast utilized. Fluoroscopy time was 45.7 minutes, AirKerma 6100mG y.  Patient will be counseled regarding radiation injury.  Recommend uninterrupted dual antiplatelet therapy with Aspirin 81mg  daily and Clopidogrel 75mg  daily for a minimum of 6 months (stable ischemic heart disease - Class I recommendation).  Discharge Exam: Blood pressure 124/65, pulse 70, temperature 98.1 F (36.7 C), resp. rate 17, height 6' (1.829 m), weight (!) 141 kg, SpO2 96 %.  Physical Exam  Constitutional: He is oriented to person, place, and time. He appears well-developed and  well-nourished.  Morbidly obese  Eyes: Pupils are equal, round, and reactive to light. Conjunctivae are normal.  Neck: Normal range of motion. Neck supple. No JVD present.  Cardiovascular: Normal rate, regular rhythm and intact distal pulses.  No murmur heard. Pulmonary/Chest: Effort normal and breath sounds normal. He has no wheezes. He has no rales.  Abdominal: Soft. Bowel sounds are normal. He exhibits no distension. There is no tenderness.  Musculoskeletal: He exhibits edema.  Lymphadenopathy:    He has no cervical adenopathy.  Neurological: He is alert and oriented to person, place, and time.  Skin: Skin is warm and dry.  Psychiatric: He has a normal mood and affect.     Disposition:   Discharge Instructions    AMB Referral to Cardiac Rehabilitation - Phase II   Complete by:  As directed    Diagnosis:  Coronary Stents     Allergies as of 03/12/2018   No Known Allergies     Medication List    STOP taking these medications   aspirin 325 MG tablet Replaced by:  aspirin 81 MG chewable tablet     TAKE these medications   alprazolam 2 MG tablet Commonly known as:  XANAX Take 2 mg by mouth 4 (four) times daily as needed for anxiety.   aspirin 81 MG chewable tablet Chew 1 tablet (81 mg total) by mouth daily. Replaces:  aspirin 325 MG tablet   atorvastatin 20 MG tablet Commonly known as:  LIPITOR Take 20 mg by mouth every evening.   BYDUREON BCISE 2 MG/0.85ML Auij Generic drug:  Exenatide ER Inject 2 mg into the skin every Monday.   clopidogrel 75 MG tablet Commonly known as:  PLAVIX Take 1 tablet (75 mg total) by mouth daily with breakfast.   HUMALOG KWIKPEN 200 UNIT/ML Sopn Generic drug:  Insulin Lispro Inject 10 Units into the skin 3 (three) times daily before meals.   JARDIANCE 25 MG Tabs tablet Generic drug:  empagliflozin Take 25 mg by mouth daily.   LEVEMIR FLEXTOUCH 100 UNIT/ML Pen Generic drug:  Insulin Detemir Inject 60 Units into the skin  every evening.   lisinopril 20 MG tablet Commonly known as:  PRINIVIL,ZESTRIL Take 20 mg by mouth daily.   metFORMIN 1000 MG tablet Commonly known as:  GLUCOPHAGE Take 1,000 mg by mouth 2 (two) times daily.   metoprolol tartrate 50 MG tablet Commonly known as:  LOPRESSOR Take 50 mg by mouth 2 (two) times daily.   oxyCODONE-acetaminophen 10-325 MG tablet Commonly known as:  PERCOCET Take 1-2 tablets by mouth every 4 (four) hours as needed for pain. What changed:  when to take this   pioglitazone 45 MG tablet Commonly known as:  ACTOS Take 45 mg by mouth every evening.   sertraline 100 MG tablet Commonly known as:  ZOLOFT Take 100 mg by mouth daily.   SYSTANE 0.4-0.3 % Soln Generic drug:  Polyethyl Glycol-Propyl Glycol Place 1 drop into both eyes  3 (three) times daily.   VIAGRA 100 MG tablet Generic drug:  sildenafil Take 100 mg by mouth daily as needed for erectile dysfunction.   zolpidem 10 MG tablet Commonly known as:  AMBIEN Take 10 mg by mouth at bedtime as needed for sleep.        SignedNigel Mormon 03/12/2018, 8:48 AM  Nigel Mormon, MD [facility] Pager: [phone] Office: [phone] If no answer Cell [phone]

## 2018-03-12 NOTE — Progress Notes (Signed)
CARDIAC REHAB PHASE I   PRE:  Rate/Rhythm: 1 SR with PVC  BP:  Sitting: 138/56      SaO2: 98 RA  MODE:  Ambulation: 400 ft   POST:  Rate/Rhythm: 82 SR  BP:  Sitting: 161/54    SaO2: 98 RA   Pt ambulated 419ft in hallway independently with steady gait. Pt denies CP, does c/o some SOB. Pt educated on importance of Plavix, ASA, anf NTG. Stent card at bedside. Pt given heart healthy and diabetic diets. Reviewed restrictions and exercise guidelines. Will refer to CRP II [place].    [id number] Rufina Falco, RN BSN 03/12/2018 9:33 AM

## 2018-03-12 NOTE — Progress Notes (Signed)
[facility] BAND REMOVAL  LOCATION:    right radial  DEFLATED PER PROTOCOL:    Yes.    TIME BAND OFF / DRESSING APPLIED:    2045   SITE UPON ARRIVAL:    Level 1  SITE AFTER BAND REMOVAL:    Level 1(buise)  CIRCULATION SENSATION AND MOVEMENT:    Within Normal Limits   Yes.    COMMENTS:   Pt.tolerated procedure well

## 2018-03-21 ENCOUNTER — Encounter: Payer: Self-pay | Admitting: Cardiology

## 2018-03-21 DIAGNOSIS — I493 Ventricular premature depolarization: Secondary | ICD-10-CM | POA: Diagnosis not present

## 2018-03-21 DIAGNOSIS — I25118 Atherosclerotic heart disease of native coronary artery with other forms of angina pectoris: Secondary | ICD-10-CM | POA: Diagnosis not present

## 2018-03-21 DIAGNOSIS — I491 Atrial premature depolarization: Secondary | ICD-10-CM | POA: Diagnosis not present

## 2018-03-21 DIAGNOSIS — E1165 Type 2 diabetes mellitus with hyperglycemia: Secondary | ICD-10-CM | POA: Diagnosis not present

## 2018-03-24 DIAGNOSIS — I491 Atrial premature depolarization: Secondary | ICD-10-CM | POA: Diagnosis not present

## 2018-03-25 DIAGNOSIS — S8992XA Unspecified injury of left lower leg, initial encounter: Secondary | ICD-10-CM | POA: Diagnosis not present

## 2018-03-25 DIAGNOSIS — M25562 Pain in left knee: Secondary | ICD-10-CM | POA: Diagnosis not present

## 2018-03-25 DIAGNOSIS — W010XXA Fall on same level from slipping, tripping and stumbling without subsequent striking against object, initial encounter: Secondary | ICD-10-CM | POA: Diagnosis not present

## 2018-03-25 DIAGNOSIS — Y998 Other external cause status: Secondary | ICD-10-CM | POA: Diagnosis not present

## 2018-03-25 DIAGNOSIS — S300XXA Contusion of lower back and pelvis, initial encounter: Secondary | ICD-10-CM | POA: Diagnosis not present

## 2018-03-25 DIAGNOSIS — R52 Pain, unspecified: Secondary | ICD-10-CM | POA: Diagnosis not present

## 2018-03-25 DIAGNOSIS — S8392XA Sprain of unspecified site of left knee, initial encounter: Secondary | ICD-10-CM | POA: Diagnosis not present

## 2018-03-25 DIAGNOSIS — Y92009 Unspecified place in unspecified non-institutional (private) residence as the place of occurrence of the external cause: Secondary | ICD-10-CM | POA: Diagnosis not present

## 2018-04-09 DIAGNOSIS — Z79899 Other long term (current) drug therapy: Secondary | ICD-10-CM | POA: Diagnosis not present

## 2018-04-09 DIAGNOSIS — Z125 Encounter for screening for malignant neoplasm of prostate: Secondary | ICD-10-CM | POA: Diagnosis not present

## 2018-04-09 DIAGNOSIS — G8929 Other chronic pain: Secondary | ICD-10-CM | POA: Diagnosis not present

## 2018-04-09 DIAGNOSIS — I10 Essential (primary) hypertension: Secondary | ICD-10-CM | POA: Diagnosis not present

## 2018-04-09 DIAGNOSIS — N390 Urinary tract infection, site not specified: Secondary | ICD-10-CM | POA: Diagnosis not present

## 2018-04-09 DIAGNOSIS — E118 Type 2 diabetes mellitus with unspecified complications: Secondary | ICD-10-CM | POA: Diagnosis not present

## 2018-04-09 DIAGNOSIS — E789 Disorder of lipoprotein metabolism, unspecified: Secondary | ICD-10-CM | POA: Diagnosis not present

## 2018-04-16 DIAGNOSIS — E118 Type 2 diabetes mellitus with unspecified complications: Secondary | ICD-10-CM | POA: Diagnosis not present

## 2018-04-16 DIAGNOSIS — Z79899 Other long term (current) drug therapy: Secondary | ICD-10-CM | POA: Diagnosis not present

## 2018-04-16 DIAGNOSIS — F419 Anxiety disorder, unspecified: Secondary | ICD-10-CM | POA: Diagnosis not present

## 2018-04-16 DIAGNOSIS — F5101 Primary insomnia: Secondary | ICD-10-CM | POA: Diagnosis not present

## 2018-04-16 DIAGNOSIS — Z0000 Encounter for general adult medical examination without abnormal findings: Secondary | ICD-10-CM | POA: Diagnosis not present

## 2018-04-16 DIAGNOSIS — Z125 Encounter for screening for malignant neoplasm of prostate: Secondary | ICD-10-CM | POA: Diagnosis not present

## 2018-04-16 DIAGNOSIS — E789 Disorder of lipoprotein metabolism, unspecified: Secondary | ICD-10-CM | POA: Diagnosis not present

## 2018-04-16 DIAGNOSIS — G8929 Other chronic pain: Secondary | ICD-10-CM | POA: Diagnosis not present

## 2018-05-30 DIAGNOSIS — R0609 Other forms of dyspnea: Secondary | ICD-10-CM | POA: Diagnosis not present

## 2018-05-30 DIAGNOSIS — I25118 Atherosclerotic heart disease of native coronary artery with other forms of angina pectoris: Secondary | ICD-10-CM | POA: Diagnosis not present

## 2018-05-30 DIAGNOSIS — I10 Essential (primary) hypertension: Secondary | ICD-10-CM | POA: Diagnosis not present
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
## 2018-06-16 ENCOUNTER — Encounter: Payer: Self-pay | Admitting: Cardiology

## 2018-06-16 ENCOUNTER — Ambulatory Visit (INDEPENDENT_AMBULATORY_CARE_PROVIDER_SITE_OTHER): Payer: Medicare Other | Admitting: Cardiology

## 2018-06-16 VITALS — BP 140/68 | HR 71 | Ht 72.0 in | Wt 317.2 lb

## 2018-06-16 DIAGNOSIS — I10 Essential (primary) hypertension: Secondary | ICD-10-CM

## 2018-06-16 DIAGNOSIS — Z794 Long term (current) use of insulin: Secondary | ICD-10-CM

## 2018-06-16 DIAGNOSIS — I25119 Atherosclerotic heart disease of native coronary artery with unspecified angina pectoris: Secondary | ICD-10-CM | POA: Diagnosis not present

## 2018-06-16 DIAGNOSIS — E119 Type 2 diabetes mellitus without complications: Secondary | ICD-10-CM | POA: Diagnosis not present

## 2018-06-25 ENCOUNTER — Other Ambulatory Visit: Payer: Self-pay | Admitting: Cardiology

## 2018-06-30 ENCOUNTER — Telehealth: Payer: Self-pay

## 2018-06-30 DIAGNOSIS — I25118 Atherosclerotic heart disease of native coronary artery with other forms of angina pectoris: Secondary | ICD-10-CM

## 2018-06-30 MED ORDER — RANOLAZINE ER 500 MG PO TB12: 500.0000 mg | ORAL_TABLET | Freq: Two times a day (BID) | ORAL | 2 refills | Status: DC

## 2018-06-30 NOTE — Telephone Encounter (Signed)
Pt called and wanted to know if you have discussed the med changes that Dr. Peter Martinique wants to make??

## 2018-06-30 NOTE — Telephone Encounter (Signed)
Please have him come in. I did discuss with Dr. Martinique and Lets add  Ranexa 500 mg  BID and I would like to see him in 6 weeks or sooner if he already has appointment. I have sent Rx

## 2018-07-01 ENCOUNTER — Other Ambulatory Visit: Payer: Self-pay

## 2018-07-01 NOTE — Telephone Encounter (Signed)
Pt aware of new script and to keep appt w/ JG

## 2018-07-10 DIAGNOSIS — E789 Disorder of lipoprotein metabolism, unspecified: Secondary | ICD-10-CM | POA: Diagnosis not present

## 2018-07-10 DIAGNOSIS — N390 Urinary tract infection, site not specified: Secondary | ICD-10-CM | POA: Diagnosis not present

## 2018-07-10 DIAGNOSIS — I10 Essential (primary) hypertension: Secondary | ICD-10-CM | POA: Diagnosis not present

## 2018-07-10 DIAGNOSIS — E118 Type 2 diabetes mellitus with unspecified complications: Secondary | ICD-10-CM | POA: Diagnosis not present

## 2018-07-16 DIAGNOSIS — G8929 Other chronic pain: Secondary | ICD-10-CM | POA: Diagnosis not present

## 2018-07-16 DIAGNOSIS — F5101 Primary insomnia: Secondary | ICD-10-CM | POA: Diagnosis not present

## 2018-07-16 DIAGNOSIS — E789 Disorder of lipoprotein metabolism, unspecified: Secondary | ICD-10-CM | POA: Diagnosis not present

## 2018-07-16 DIAGNOSIS — R21 Rash and other nonspecific skin eruption: Secondary | ICD-10-CM | POA: Diagnosis not present

## 2018-07-16 DIAGNOSIS — I10 Essential (primary) hypertension: Secondary | ICD-10-CM | POA: Diagnosis not present

## 2018-07-16 DIAGNOSIS — E119 Type 2 diabetes mellitus without complications: Secondary | ICD-10-CM | POA: Diagnosis not present

## 2018-07-16 DIAGNOSIS — F419 Anxiety disorder, unspecified: Secondary | ICD-10-CM | POA: Diagnosis not present

## 2018-07-17 DIAGNOSIS — Z79899 Other long term (current) drug therapy: Secondary | ICD-10-CM | POA: Diagnosis not present

## 2018-07-17 DIAGNOSIS — G8929 Other chronic pain: Secondary | ICD-10-CM | POA: Diagnosis not present

## 2018-07-23 DIAGNOSIS — Z79899 Other long term (current) drug therapy: Secondary | ICD-10-CM | POA: Diagnosis not present

## 2018-07-23 DIAGNOSIS — Z5181 Encounter for therapeutic drug level monitoring: Secondary | ICD-10-CM | POA: Diagnosis not present

## 2018-08-01 ENCOUNTER — Ambulatory Visit: Payer: Self-pay | Admitting: Cardiology

## 2018-08-13 DIAGNOSIS — Z79899 Other long term (current) drug therapy: Secondary | ICD-10-CM | POA: Diagnosis not present
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
## 2018-09-24 ENCOUNTER — Ambulatory Visit: Payer: Self-pay | Admitting: Cardiology

## 2018-09-26 ENCOUNTER — Other Ambulatory Visit: Payer: Self-pay | Admitting: Cardiology

## 2018-09-26 DIAGNOSIS — I25118 Atherosclerotic heart disease of native coronary artery with other forms of angina pectoris: Secondary | ICD-10-CM

## 2018-09-26 MED ORDER — RANOLAZINE ER 500 MG PO TB12: 500.0000 mg | ORAL_TABLET | Freq: Two times a day (BID) | ORAL | 1 refills | Status: DC

## 2018-10-14 DIAGNOSIS — E789 Disorder of lipoprotein metabolism, unspecified: Secondary | ICD-10-CM | POA: Diagnosis not present

## 2018-10-14 DIAGNOSIS — G8929 Other chronic pain: Secondary | ICD-10-CM | POA: Diagnosis not present

## 2018-10-14 DIAGNOSIS — I10 Essential (primary) hypertension: Secondary | ICD-10-CM | POA: Diagnosis not present

## 2018-10-14 DIAGNOSIS — E119 Type 2 diabetes mellitus without complications: Secondary | ICD-10-CM | POA: Diagnosis not present

## 2018-10-14 DIAGNOSIS — Z79899 Other long term (current) drug therapy: Secondary | ICD-10-CM | POA: Diagnosis not present

## 2018-10-20 DIAGNOSIS — E119 Type 2 diabetes mellitus without complications: Secondary | ICD-10-CM | POA: Diagnosis not present

## 2018-10-20 DIAGNOSIS — F5101 Primary insomnia: Secondary | ICD-10-CM | POA: Diagnosis not present

## 2018-10-20 DIAGNOSIS — F419 Anxiety disorder, unspecified: Secondary | ICD-10-CM | POA: Diagnosis not present

## 2018-10-20 DIAGNOSIS — I2510 Atherosclerotic heart disease of native coronary artery without angina pectoris: Secondary | ICD-10-CM | POA: Diagnosis not present

## 2018-10-20 DIAGNOSIS — Z79899 Other long term (current) drug therapy: Secondary | ICD-10-CM | POA: Diagnosis not present

## 2018-10-20 DIAGNOSIS — G8929 Other chronic pain: Secondary | ICD-10-CM | POA: Diagnosis not present

## 2018-10-20 DIAGNOSIS — I10 Essential (primary) hypertension: Secondary | ICD-10-CM | POA: Diagnosis not present

## 2018-10-25 ENCOUNTER — Other Ambulatory Visit: Payer: Self-pay | Admitting: Cardiology

## 2018-10-25 DIAGNOSIS — I25118 Atherosclerotic heart disease of native coronary artery with other forms of angina pectoris: Secondary | ICD-10-CM

## 2018-11-22 ENCOUNTER — Other Ambulatory Visit: Payer: Self-pay | Admitting: Cardiology

## 2018-11-22 DIAGNOSIS — I25118 Atherosclerotic heart disease of native coronary artery with other forms of angina pectoris: Secondary | ICD-10-CM

## 2018-12-11 ENCOUNTER — Other Ambulatory Visit: Payer: Self-pay

## 2018-12-28 ENCOUNTER — Other Ambulatory Visit: Payer: Self-pay | Admitting: Cardiology

## 2018-12-28 DIAGNOSIS — I25118 Atherosclerotic heart disease of native coronary artery with other forms of angina pectoris: Secondary | ICD-10-CM

## 2019-01-15 DIAGNOSIS — E785 Hyperlipidemia, unspecified: Secondary | ICD-10-CM | POA: Diagnosis not present

## 2019-01-15 DIAGNOSIS — N390 Urinary tract infection, site not specified: Secondary | ICD-10-CM | POA: Diagnosis not present

## 2019-01-15 DIAGNOSIS — I10 Essential (primary) hypertension: Secondary | ICD-10-CM | POA: Diagnosis not present

## 2019-01-15 DIAGNOSIS — R739 Hyperglycemia, unspecified: Secondary | ICD-10-CM | POA: Diagnosis not present

## 2019-01-21 ENCOUNTER — Other Ambulatory Visit: Payer: Self-pay | Admitting: Cardiology

## 2019-01-21 DIAGNOSIS — I25118 Atherosclerotic heart disease of native coronary artery with other forms of angina pectoris: Secondary | ICD-10-CM

## 2019-01-22 DIAGNOSIS — E119 Type 2 diabetes mellitus without complications: Secondary | ICD-10-CM | POA: Diagnosis not present

## 2019-01-22 DIAGNOSIS — F5101 Primary insomnia: Secondary | ICD-10-CM | POA: Diagnosis not present

## 2019-01-22 DIAGNOSIS — Z79899 Other long term (current) drug therapy: Secondary | ICD-10-CM | POA: Diagnosis not present

## 2019-01-22 DIAGNOSIS — I10 Essential (primary) hypertension: Secondary | ICD-10-CM | POA: Diagnosis not present

## 2019-01-22 DIAGNOSIS — E785 Hyperlipidemia, unspecified: Secondary | ICD-10-CM | POA: Diagnosis not present

## 2019-01-22 DIAGNOSIS — M25561 Pain in right knee: Secondary | ICD-10-CM | POA: Diagnosis not present

## 2019-02-13 ENCOUNTER — Other Ambulatory Visit: Payer: Self-pay | Admitting: Cardiology

## 2019-02-13 DIAGNOSIS — I25118 Atherosclerotic heart disease of native coronary artery with other forms of angina pectoris: Secondary | ICD-10-CM

## 2019-03-02 ENCOUNTER — Other Ambulatory Visit: Payer: Self-pay

## 2019-03-02 MED ORDER — CLOPIDOGREL BISULFATE 75 MG PO TABS: 75.0000 mg | ORAL_TABLET | Freq: Every day | ORAL | 3 refills | Status: DC

## 2019-04-15 DIAGNOSIS — Z79899 Other long term (current) drug therapy: Secondary | ICD-10-CM | POA: Diagnosis not present

## 2019-04-15 DIAGNOSIS — I10 Essential (primary) hypertension: Secondary | ICD-10-CM | POA: Diagnosis not present

## 2019-04-15 DIAGNOSIS — E119 Type 2 diabetes mellitus without complications: Secondary | ICD-10-CM | POA: Diagnosis not present

## 2019-07-15 DIAGNOSIS — E119 Type 2 diabetes mellitus without complications: Secondary | ICD-10-CM | POA: Diagnosis not present

## 2019-07-15 DIAGNOSIS — I10 Essential (primary) hypertension: Secondary | ICD-10-CM | POA: Diagnosis not present

## 2019-07-15 DIAGNOSIS — Z79899 Other long term (current) drug therapy: Secondary | ICD-10-CM | POA: Diagnosis not present

## 2019-07-27 DIAGNOSIS — N390 Urinary tract infection, site not specified: Secondary | ICD-10-CM | POA: Diagnosis not present

## 2019-07-27 DIAGNOSIS — E119 Type 2 diabetes mellitus without complications: Secondary | ICD-10-CM | POA: Diagnosis not present

## 2019-07-27 DIAGNOSIS — F419 Anxiety disorder, unspecified: Secondary | ICD-10-CM | POA: Diagnosis not present

## 2019-07-27 DIAGNOSIS — R399 Unspecified symptoms and signs involving the genitourinary system: Secondary | ICD-10-CM | POA: Diagnosis not present

## 2019-07-27 DIAGNOSIS — F5101 Primary insomnia: Secondary | ICD-10-CM | POA: Diagnosis not present

## 2019-07-27 DIAGNOSIS — R21 Rash and other nonspecific skin eruption: Secondary | ICD-10-CM | POA: Diagnosis not present

## 2019-07-29 DIAGNOSIS — H259 Unspecified age-related cataract: Secondary | ICD-10-CM | POA: Diagnosis not present

## 2019-07-29 DIAGNOSIS — E119 Type 2 diabetes mellitus without complications: Secondary | ICD-10-CM | POA: Diagnosis not present

## 2019-07-29 DIAGNOSIS — Z135 Encounter for screening for eye and ear disorders: Secondary | ICD-10-CM | POA: Diagnosis not present

## 2019-10-01 DIAGNOSIS — H25013 Cortical age-related cataract, bilateral: Secondary | ICD-10-CM | POA: Diagnosis not present

## 2019-10-01 DIAGNOSIS — H2513 Age-related nuclear cataract, bilateral: Secondary | ICD-10-CM | POA: Diagnosis not present

## 2019-10-01 DIAGNOSIS — H25043 Posterior subcapsular polar age-related cataract, bilateral: Secondary | ICD-10-CM | POA: Diagnosis not present

## 2019-10-01 DIAGNOSIS — H2511 Age-related nuclear cataract, right eye: Secondary | ICD-10-CM | POA: Diagnosis not present

## 2019-10-01 DIAGNOSIS — E119 Type 2 diabetes mellitus without complications: Secondary | ICD-10-CM | POA: Diagnosis not present

## 2019-10-21 DIAGNOSIS — H25811 Combined forms of age-related cataract, right eye: Secondary | ICD-10-CM | POA: Diagnosis not present

## 2019-10-21 DIAGNOSIS — E119 Type 2 diabetes mellitus without complications: Secondary | ICD-10-CM | POA: Diagnosis not present

## 2019-10-21 DIAGNOSIS — Z961 Presence of intraocular lens: Secondary | ICD-10-CM | POA: Diagnosis not present

## 2019-10-21 DIAGNOSIS — N390 Urinary tract infection, site not specified: Secondary | ICD-10-CM | POA: Diagnosis not present

## 2019-10-21 DIAGNOSIS — E785 Hyperlipidemia, unspecified: Secondary | ICD-10-CM | POA: Diagnosis not present

## 2019-10-21 DIAGNOSIS — H2511 Age-related nuclear cataract, right eye: Secondary | ICD-10-CM | POA: Diagnosis not present

## 2019-10-21 DIAGNOSIS — I10 Essential (primary) hypertension: Secondary | ICD-10-CM | POA: Diagnosis not present

## 2019-10-22 DIAGNOSIS — H2512 Age-related nuclear cataract, left eye: Secondary | ICD-10-CM | POA: Diagnosis not present

## 2020-01-20 DIAGNOSIS — E119 Type 2 diabetes mellitus without complications: Secondary | ICD-10-CM | POA: Diagnosis not present

## 2020-01-20 DIAGNOSIS — Z79891 Long term (current) use of opiate analgesic: Secondary | ICD-10-CM | POA: Diagnosis not present

## 2020-01-20 DIAGNOSIS — N390 Urinary tract infection, site not specified: Secondary | ICD-10-CM | POA: Diagnosis not present

## 2020-01-20 DIAGNOSIS — Z125 Encounter for screening for malignant neoplasm of prostate: Secondary | ICD-10-CM | POA: Diagnosis not present

## 2020-01-20 DIAGNOSIS — E785 Hyperlipidemia, unspecified: Secondary | ICD-10-CM | POA: Diagnosis not present

## 2020-01-20 DIAGNOSIS — I10 Essential (primary) hypertension: Secondary | ICD-10-CM | POA: Diagnosis not present

## 2020-01-28 DIAGNOSIS — E785 Hyperlipidemia, unspecified: Secondary | ICD-10-CM | POA: Diagnosis not present

## 2020-01-28 DIAGNOSIS — I10 Essential (primary) hypertension: Secondary | ICD-10-CM | POA: Diagnosis not present

## 2020-01-28 DIAGNOSIS — Z79899 Other long term (current) drug therapy: Secondary | ICD-10-CM | POA: Diagnosis not present

## 2020-01-28 DIAGNOSIS — F419 Anxiety disorder, unspecified: Secondary | ICD-10-CM | POA: Diagnosis not present

## 2020-01-28 DIAGNOSIS — N390 Urinary tract infection, site not specified: Secondary | ICD-10-CM | POA: Diagnosis not present

## 2020-01-28 DIAGNOSIS — Z23 Encounter for immunization: Secondary | ICD-10-CM | POA: Diagnosis not present

## 2020-01-28 DIAGNOSIS — I2510 Atherosclerotic heart disease of native coronary artery without angina pectoris: Secondary | ICD-10-CM | POA: Diagnosis not present

## 2020-01-28 DIAGNOSIS — F5101 Primary insomnia: Secondary | ICD-10-CM | POA: Diagnosis not present

## 2020-01-28 DIAGNOSIS — E119 Type 2 diabetes mellitus without complications: Secondary | ICD-10-CM | POA: Diagnosis not present

## 2020-01-28 DIAGNOSIS — E669 Obesity, unspecified: Secondary | ICD-10-CM | POA: Diagnosis not present

## 2020-01-28 DIAGNOSIS — M25561 Pain in right knee: Secondary | ICD-10-CM | POA: Diagnosis not present

## 2020-02-23 ENCOUNTER — Other Ambulatory Visit: Payer: Self-pay | Admitting: Cardiology

## 2020-04-14 DIAGNOSIS — Z87440 Personal history of urinary (tract) infections: Secondary | ICD-10-CM | POA: Diagnosis not present

## 2020-04-14 DIAGNOSIS — R8290 Unspecified abnormal findings in urine: Secondary | ICD-10-CM | POA: Diagnosis not present

## 2020-04-21 DIAGNOSIS — Z125 Encounter for screening for malignant neoplasm of prostate: Secondary | ICD-10-CM | POA: Diagnosis not present

## 2020-04-21 DIAGNOSIS — E119 Type 2 diabetes mellitus without complications: Secondary | ICD-10-CM | POA: Diagnosis not present

## 2020-04-21 DIAGNOSIS — Z79899 Other long term (current) drug therapy: Secondary | ICD-10-CM | POA: Diagnosis not present

## 2020-04-21 DIAGNOSIS — E785 Hyperlipidemia, unspecified: Secondary | ICD-10-CM | POA: Diagnosis not present

## 2020-04-21 DIAGNOSIS — I10 Essential (primary) hypertension: Secondary | ICD-10-CM | POA: Diagnosis not present

## 2020-05-02 DIAGNOSIS — E785 Hyperlipidemia, unspecified: Secondary | ICD-10-CM | POA: Diagnosis not present

## 2020-05-02 DIAGNOSIS — M25561 Pain in right knee: Secondary | ICD-10-CM | POA: Diagnosis not present

## 2020-05-02 DIAGNOSIS — F5101 Primary insomnia: Secondary | ICD-10-CM | POA: Diagnosis not present

## 2020-05-02 DIAGNOSIS — I2510 Atherosclerotic heart disease of native coronary artery without angina pectoris: Secondary | ICD-10-CM | POA: Diagnosis not present

## 2020-05-02 DIAGNOSIS — F419 Anxiety disorder, unspecified: Secondary | ICD-10-CM | POA: Diagnosis not present

## 2020-05-02 DIAGNOSIS — E119 Type 2 diabetes mellitus without complications: Secondary | ICD-10-CM | POA: Diagnosis not present

## 2020-05-02 DIAGNOSIS — E669 Obesity, unspecified: Secondary | ICD-10-CM | POA: Diagnosis not present

## 2020-05-02 DIAGNOSIS — I10 Essential (primary) hypertension: Secondary | ICD-10-CM | POA: Diagnosis not present

## 2020-05-02 DIAGNOSIS — R21 Rash and other nonspecific skin eruption: Secondary | ICD-10-CM | POA: Diagnosis not present

## 2020-07-26 DIAGNOSIS — R946 Abnormal results of thyroid function studies: Secondary | ICD-10-CM | POA: Diagnosis not present

## 2020-07-26 DIAGNOSIS — Z125 Encounter for screening for malignant neoplasm of prostate: Secondary | ICD-10-CM | POA: Diagnosis not present

## 2020-07-26 DIAGNOSIS — E119 Type 2 diabetes mellitus without complications: Secondary | ICD-10-CM | POA: Diagnosis not present

## 2020-07-26 DIAGNOSIS — I10 Essential (primary) hypertension: Secondary | ICD-10-CM | POA: Diagnosis not present

## 2020-07-26 DIAGNOSIS — E756 Lipid storage disorder, unspecified: Secondary | ICD-10-CM | POA: Diagnosis not present

## 2020-07-28 DIAGNOSIS — R896 Abnormal cytological findings in specimens from other organs, systems and tissues: Secondary | ICD-10-CM | POA: Diagnosis not present

## 2020-07-28 DIAGNOSIS — N329 Bladder disorder, unspecified: Secondary | ICD-10-CM | POA: Diagnosis not present

## 2020-07-28 DIAGNOSIS — N3000 Acute cystitis without hematuria: Secondary | ICD-10-CM | POA: Diagnosis not present

## 2020-07-28 DIAGNOSIS — M160 Bilateral primary osteoarthritis of hip: Secondary | ICD-10-CM | POA: Diagnosis not present

## 2020-07-28 DIAGNOSIS — Z87440 Personal history of urinary (tract) infections: Secondary | ICD-10-CM | POA: Diagnosis not present

## 2020-07-28 DIAGNOSIS — K632 Fistula of intestine: Secondary | ICD-10-CM | POA: Diagnosis not present

## 2020-07-28 DIAGNOSIS — M47816 Spondylosis without myelopathy or radiculopathy, lumbar region: Secondary | ICD-10-CM | POA: Diagnosis not present

## 2020-07-28 DIAGNOSIS — N390 Urinary tract infection, site not specified: Secondary | ICD-10-CM | POA: Diagnosis not present

## 2020-07-28 DIAGNOSIS — N321 Vesicointestinal fistula: Secondary | ICD-10-CM | POA: Diagnosis not present

## 2020-08-05 DIAGNOSIS — I10 Essential (primary) hypertension: Secondary | ICD-10-CM | POA: Diagnosis not present

## 2020-08-05 DIAGNOSIS — F5101 Primary insomnia: Secondary | ICD-10-CM | POA: Diagnosis not present

## 2020-08-05 DIAGNOSIS — G8929 Other chronic pain: Secondary | ICD-10-CM | POA: Diagnosis not present

## 2020-08-05 DIAGNOSIS — E118 Type 2 diabetes mellitus with unspecified complications: Secondary | ICD-10-CM | POA: Diagnosis not present

## 2020-08-05 DIAGNOSIS — Z23 Encounter for immunization: Secondary | ICD-10-CM | POA: Diagnosis not present

## 2020-08-05 DIAGNOSIS — F419 Anxiety disorder, unspecified: Secondary | ICD-10-CM | POA: Diagnosis not present

## 2020-08-05 DIAGNOSIS — E875 Hyperkalemia: Secondary | ICD-10-CM | POA: Diagnosis not present

## 2020-08-11 DIAGNOSIS — R935 Abnormal findings on diagnostic imaging of other abdominal regions, including retroperitoneum: Secondary | ICD-10-CM | POA: Diagnosis not present

## 2020-08-11 DIAGNOSIS — N390 Urinary tract infection, site not specified: Secondary | ICD-10-CM | POA: Diagnosis not present

## 2020-08-22 DIAGNOSIS — E756 Lipid storage disorder, unspecified: Secondary | ICD-10-CM | POA: Diagnosis not present

## 2020-08-22 DIAGNOSIS — I2510 Atherosclerotic heart disease of native coronary artery without angina pectoris: Secondary | ICD-10-CM | POA: Diagnosis not present

## 2020-08-22 DIAGNOSIS — E118 Type 2 diabetes mellitus with unspecified complications: Secondary | ICD-10-CM | POA: Diagnosis not present

## 2020-08-22 DIAGNOSIS — I10 Essential (primary) hypertension: Secondary | ICD-10-CM | POA: Diagnosis not present

## 2020-08-29 DIAGNOSIS — N321 Vesicointestinal fistula: Secondary | ICD-10-CM | POA: Diagnosis not present

## 2020-08-29 DIAGNOSIS — Z435 Encounter for attention to cystostomy: Secondary | ICD-10-CM | POA: Diagnosis not present

## 2020-11-16 DIAGNOSIS — R946 Abnormal results of thyroid function studies: Secondary | ICD-10-CM | POA: Diagnosis not present

## 2020-11-16 DIAGNOSIS — E118 Type 2 diabetes mellitus with unspecified complications: Secondary | ICD-10-CM | POA: Diagnosis not present

## 2020-11-16 DIAGNOSIS — I10 Essential (primary) hypertension: Secondary | ICD-10-CM | POA: Diagnosis not present

## 2020-11-16 DIAGNOSIS — E756 Lipid storage disorder, unspecified: Secondary | ICD-10-CM | POA: Diagnosis not present

## 2020-11-16 DIAGNOSIS — E785 Hyperlipidemia, unspecified: Secondary | ICD-10-CM | POA: Diagnosis not present

## 2020-11-16 DIAGNOSIS — E875 Hyperkalemia: Secondary | ICD-10-CM | POA: Diagnosis not present

## 2020-11-21 DIAGNOSIS — I2510 Atherosclerotic heart disease of native coronary artery without angina pectoris: Secondary | ICD-10-CM | POA: Diagnosis not present

## 2020-11-21 DIAGNOSIS — E785 Hyperlipidemia, unspecified: Secondary | ICD-10-CM | POA: Diagnosis not present

## 2020-11-21 DIAGNOSIS — Z125 Encounter for screening for malignant neoplasm of prostate: Secondary | ICD-10-CM | POA: Diagnosis not present

## 2020-11-21 DIAGNOSIS — Z79899 Other long term (current) drug therapy: Secondary | ICD-10-CM | POA: Diagnosis not present

## 2020-11-21 DIAGNOSIS — I10 Essential (primary) hypertension: Secondary | ICD-10-CM | POA: Diagnosis not present

## 2020-11-21 DIAGNOSIS — F5101 Primary insomnia: Secondary | ICD-10-CM | POA: Diagnosis not present

## 2020-11-21 DIAGNOSIS — Z23 Encounter for immunization: Secondary | ICD-10-CM | POA: Diagnosis not present

## 2020-11-21 DIAGNOSIS — G8929 Other chronic pain: Secondary | ICD-10-CM | POA: Diagnosis not present

## 2020-11-21 DIAGNOSIS — E0865 Diabetes mellitus due to underlying condition with hyperglycemia: Secondary | ICD-10-CM | POA: Diagnosis not present

## 2020-11-21 DIAGNOSIS — N3000 Acute cystitis without hematuria: Secondary | ICD-10-CM | POA: Diagnosis not present

## 2020-12-12 DIAGNOSIS — Z23 Encounter for immunization: Secondary | ICD-10-CM | POA: Diagnosis not present

## 2020-12-13 DIAGNOSIS — Z9861 Coronary angioplasty status: Secondary | ICD-10-CM | POA: Diagnosis not present

## 2020-12-13 DIAGNOSIS — E1165 Type 2 diabetes mellitus with hyperglycemia: Secondary | ICD-10-CM | POA: Diagnosis not present

## 2020-12-13 DIAGNOSIS — I2510 Atherosclerotic heart disease of native coronary artery without angina pectoris: Secondary | ICD-10-CM | POA: Diagnosis not present

## 2020-12-13 DIAGNOSIS — E785 Hyperlipidemia, unspecified: Secondary | ICD-10-CM | POA: Diagnosis not present

## 2020-12-13 DIAGNOSIS — Z794 Long term (current) use of insulin: Secondary | ICD-10-CM | POA: Diagnosis not present

## 2020-12-13 DIAGNOSIS — I10 Essential (primary) hypertension: Secondary | ICD-10-CM | POA: Diagnosis not present

## 2021-02-14 DIAGNOSIS — R809 Proteinuria, unspecified: Secondary | ICD-10-CM | POA: Diagnosis not present

## 2021-02-14 DIAGNOSIS — E1165 Type 2 diabetes mellitus with hyperglycemia: Secondary | ICD-10-CM | POA: Diagnosis not present

## 2021-02-14 DIAGNOSIS — Z794 Long term (current) use of insulin: Secondary | ICD-10-CM | POA: Diagnosis not present

## 2021-02-14 DIAGNOSIS — E785 Hyperlipidemia, unspecified: Secondary | ICD-10-CM | POA: Diagnosis not present

## 2021-02-14 DIAGNOSIS — Z9861 Coronary angioplasty status: Secondary | ICD-10-CM | POA: Diagnosis not present

## 2021-02-14 DIAGNOSIS — I2510 Atherosclerotic heart disease of native coronary artery without angina pectoris: Secondary | ICD-10-CM | POA: Diagnosis not present

## 2021-02-14 DIAGNOSIS — I10 Essential (primary) hypertension: Secondary | ICD-10-CM | POA: Diagnosis not present

## 2021-02-14 DIAGNOSIS — E1129 Type 2 diabetes mellitus with other diabetic kidney complication: Secondary | ICD-10-CM | POA: Diagnosis not present

## 2021-02-15 DIAGNOSIS — Z79899 Other long term (current) drug therapy: Secondary | ICD-10-CM | POA: Diagnosis not present

## 2021-02-15 DIAGNOSIS — I10 Essential (primary) hypertension: Secondary | ICD-10-CM | POA: Diagnosis not present

## 2021-02-15 DIAGNOSIS — E0865 Diabetes mellitus due to underlying condition with hyperglycemia: Secondary | ICD-10-CM | POA: Diagnosis not present

## 2021-02-15 DIAGNOSIS — Z125 Encounter for screening for malignant neoplasm of prostate: Secondary | ICD-10-CM | POA: Diagnosis not present

## 2021-02-15 DIAGNOSIS — E785 Hyperlipidemia, unspecified: Secondary | ICD-10-CM | POA: Diagnosis not present

## 2021-02-16 ENCOUNTER — Other Ambulatory Visit: Payer: Self-pay | Admitting: Cardiology

## 2021-02-22 DIAGNOSIS — G8929 Other chronic pain: Secondary | ICD-10-CM | POA: Diagnosis not present

## 2021-02-22 DIAGNOSIS — Z0000 Encounter for general adult medical examination without abnormal findings: Secondary | ICD-10-CM | POA: Diagnosis not present

## 2021-02-22 DIAGNOSIS — N390 Urinary tract infection, site not specified: Secondary | ICD-10-CM | POA: Diagnosis not present

## 2021-02-22 DIAGNOSIS — Z23 Encounter for immunization: Secondary | ICD-10-CM | POA: Diagnosis not present

## 2021-02-22 DIAGNOSIS — N182 Chronic kidney disease, stage 2 (mild): Secondary | ICD-10-CM | POA: Diagnosis not present

## 2021-02-22 DIAGNOSIS — E785 Hyperlipidemia, unspecified: Secondary | ICD-10-CM | POA: Diagnosis not present

## 2021-02-22 DIAGNOSIS — E1129 Type 2 diabetes mellitus with other diabetic kidney complication: Secondary | ICD-10-CM | POA: Diagnosis not present

## 2021-02-22 DIAGNOSIS — Z794 Long term (current) use of insulin: Secondary | ICD-10-CM | POA: Diagnosis not present

## 2021-02-22 DIAGNOSIS — I10 Essential (primary) hypertension: Secondary | ICD-10-CM | POA: Diagnosis not present

## 2021-02-22 DIAGNOSIS — Z9861 Coronary angioplasty status: Secondary | ICD-10-CM | POA: Diagnosis not present

## 2021-03-15 ENCOUNTER — Other Ambulatory Visit (HOSPITAL_COMMUNITY): Payer: Self-pay

## 2021-03-15 DIAGNOSIS — N390 Urinary tract infection, site not specified: Secondary | ICD-10-CM | POA: Diagnosis not present

## 2021-03-15 DIAGNOSIS — Z9861 Coronary angioplasty status: Secondary | ICD-10-CM | POA: Diagnosis not present

## 2021-03-15 DIAGNOSIS — R809 Proteinuria, unspecified: Secondary | ICD-10-CM | POA: Diagnosis not present

## 2021-03-15 DIAGNOSIS — I2510 Atherosclerotic heart disease of native coronary artery without angina pectoris: Secondary | ICD-10-CM | POA: Diagnosis not present

## 2021-03-15 DIAGNOSIS — Z794 Long term (current) use of insulin: Secondary | ICD-10-CM | POA: Diagnosis not present

## 2021-03-15 DIAGNOSIS — E1165 Type 2 diabetes mellitus with hyperglycemia: Secondary | ICD-10-CM | POA: Diagnosis not present

## 2021-03-15 DIAGNOSIS — I10 Essential (primary) hypertension: Secondary | ICD-10-CM | POA: Diagnosis not present

## 2021-03-15 DIAGNOSIS — E785 Hyperlipidemia, unspecified: Secondary | ICD-10-CM | POA: Diagnosis not present

## 2021-03-15 DIAGNOSIS — E1129 Type 2 diabetes mellitus with other diabetic kidney complication: Secondary | ICD-10-CM | POA: Diagnosis not present

## 2021-03-15 MED ORDER — NITROFURANTOIN MONOHYD MACRO 100 MG PO CAPS: 100.0000 mg | ORAL_CAPSULE | Freq: Every day | ORAL | 6 refills | Status: DC

## 2021-03-15 MED ORDER — LISINOPRIL 2.5 MG PO TABS
2.5000 mg | ORAL_TABLET | Freq: Every day | ORAL | 0 refills | Status: DC
  Filled 2021-03-15: qty 90, 90d supply, fill #0

## 2021-03-15 MED ORDER — INSULIN PEN NEEDLE 31G X 5 MM MISC: 0 refills | Status: AC

## 2021-03-15 MED ORDER — OZEMPIC (2 MG/DOSE) 8 MG/3ML ~~LOC~~ SOPN
PEN_INJECTOR | SUBCUTANEOUS | 3 refills | Status: DC
  Filled 2021-03-15: qty 3, 28d supply, fill #0

## 2021-03-15 MED ORDER — ALPRAZOLAM 2 MG PO TABS
2.0000 mg | ORAL_TABLET | Freq: Four times a day (QID) | ORAL | 0 refills | Status: DC
  Filled 2021-03-15 – 2021-04-03 (×2): qty 120, 30d supply, fill #0

## 2021-03-15 MED ORDER — METFORMIN HCL 1000 MG PO TABS
1000.0000 mg | ORAL_TABLET | Freq: Two times a day (BID) | ORAL | 0 refills | Status: DC
  Filled 2021-07-17: qty 180, 90d supply, fill #0

## 2021-03-15 MED ORDER — PIOGLITAZONE HCL 45 MG PO TABS: 45.0000 mg | ORAL_TABLET | Freq: Every day | ORAL | 0 refills | Status: DC

## 2021-03-15 MED ORDER — OZEMPIC (0.25 OR 0.5 MG/DOSE) 2 MG/1.5ML ~~LOC~~ SOPN: PEN_INJECTOR | SUBCUTANEOUS | 0 refills | Status: DC

## 2021-03-15 MED ORDER — NITROFURANTOIN MACROCRYSTAL 100 MG PO CAPS
100.0000 mg | ORAL_CAPSULE | Freq: Every evening | ORAL | 0 refills | Status: DC
Start: 2020-07-28 — End: 2021-03-30

## 2021-03-15 MED ORDER — OZEMPIC (1 MG/DOSE) 4 MG/3ML ~~LOC~~ SOPN: PEN_INJECTOR | SUBCUTANEOUS | 1 refills | Status: DC

## 2021-03-27 ENCOUNTER — Ambulatory Visit: Payer: Medicare Other | Admitting: Cardiology

## 2021-03-30 ENCOUNTER — Ambulatory Visit: Payer: Medicare Other | Admitting: Cardiology

## 2021-03-30 ENCOUNTER — Encounter: Payer: Self-pay | Admitting: Cardiology

## 2021-03-30 ENCOUNTER — Other Ambulatory Visit: Payer: Self-pay

## 2021-03-30 ENCOUNTER — Other Ambulatory Visit (HOSPITAL_COMMUNITY): Payer: Self-pay

## 2021-03-30 VITALS — BP 138/70 | HR 82 | Temp 98.0°F | Resp 17 | Ht 72.0 in | Wt 301.8 lb

## 2021-03-30 DIAGNOSIS — I10 Essential (primary) hypertension: Secondary | ICD-10-CM

## 2021-03-30 DIAGNOSIS — E7800 Pure hypercholesterolemia, unspecified: Secondary | ICD-10-CM | POA: Diagnosis not present

## 2021-03-30 DIAGNOSIS — E1165 Type 2 diabetes mellitus with hyperglycemia: Secondary | ICD-10-CM

## 2021-03-30 DIAGNOSIS — I2510 Atherosclerotic heart disease of native coronary artery without angina pectoris: Secondary | ICD-10-CM

## 2021-03-30 DIAGNOSIS — Z794 Long term (current) use of insulin: Secondary | ICD-10-CM | POA: Diagnosis not present

## 2021-03-30 MED ORDER — ASPIRIN 81 MG PO CHEW: 81.0000 mg | CHEWABLE_TABLET | Freq: Every day | ORAL | 2 refills | Status: AC
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
## 2021-03-31 MED ORDER — CLOPIDOGREL BISULFATE 75 MG PO TABS: 75.0000 mg | ORAL_TABLET | Freq: Every day | ORAL | 3 refills | Status: DC

## 2021-04-03 ENCOUNTER — Other Ambulatory Visit (HOSPITAL_COMMUNITY): Payer: Self-pay

## 2021-04-04 ENCOUNTER — Other Ambulatory Visit (HOSPITAL_COMMUNITY): Payer: Self-pay

## 2021-04-04 MED ORDER — SERTRALINE HCL 100 MG PO TABS
ORAL_TABLET | ORAL | 1 refills | Status: DC
  Filled 2021-04-04: qty 90, 90d supply, fill #0
  Filled 2021-08-02: qty 90, 90d supply, fill #1

## 2021-04-04 MED ORDER — ZOLPIDEM TARTRATE 10 MG PO TABS
ORAL_TABLET | ORAL | 0 refills | Status: DC
  Filled 2021-04-04: qty 30, 30d supply, fill #0

## 2021-04-04 MED ORDER — ATORVASTATIN CALCIUM 40 MG PO TABS
ORAL_TABLET | ORAL | 1 refills | Status: DC
  Filled 2021-04-04: qty 90, 90d supply, fill #0
  Filled 2021-08-02: qty 90, 90d supply, fill #1

## 2021-04-04 MED ORDER — INSULIN PEN NEEDLE 31G X 8 MM MISC
1 refills | Status: DC
  Filled 2021-04-04: qty 100, 50d supply, fill #0
  Filled 2021-07-03: qty 100, 50d supply, fill #1
  Filled 2021-10-30: qty 100, 50d supply, fill #2

## 2021-04-12 ENCOUNTER — Other Ambulatory Visit (HOSPITAL_COMMUNITY): Payer: Self-pay

## 2021-04-17 ENCOUNTER — Other Ambulatory Visit (HOSPITAL_COMMUNITY): Payer: Self-pay

## 2021-04-17 MED ORDER — OZEMPIC (2 MG/DOSE) 8 MG/3ML ~~LOC~~ SOPN
PEN_INJECTOR | SUBCUTANEOUS | 3 refills | Status: DC
Start: 2021-04-17 — End: 2021-07-05
  Filled 2021-04-17 – 2021-04-18 (×2): qty 3, 28d supply, fill #0
  Filled 2021-05-03: qty 3, 28d supply, fill #1

## 2021-04-18 ENCOUNTER — Other Ambulatory Visit (HOSPITAL_COMMUNITY): Payer: Self-pay

## 2021-04-19 ENCOUNTER — Other Ambulatory Visit (HOSPITAL_COMMUNITY): Payer: Self-pay

## 2021-04-19 DIAGNOSIS — E1129 Type 2 diabetes mellitus with other diabetic kidney complication: Secondary | ICD-10-CM | POA: Diagnosis not present

## 2021-04-19 DIAGNOSIS — I10 Essential (primary) hypertension: Secondary | ICD-10-CM | POA: Diagnosis not present

## 2021-04-19 DIAGNOSIS — Z9861 Coronary angioplasty status: Secondary | ICD-10-CM | POA: Diagnosis not present

## 2021-04-19 DIAGNOSIS — Z7902 Long term (current) use of antithrombotics/antiplatelets: Secondary | ICD-10-CM | POA: Diagnosis not present

## 2021-04-19 DIAGNOSIS — Z794 Long term (current) use of insulin: Secondary | ICD-10-CM | POA: Diagnosis not present

## 2021-04-19 DIAGNOSIS — E785 Hyperlipidemia, unspecified: Secondary | ICD-10-CM | POA: Diagnosis not present

## 2021-04-19 MED ORDER — TRESIBA FLEXTOUCH 200 UNIT/ML ~~LOC~~ SOPN
PEN_INJECTOR | SUBCUTANEOUS | 3 refills | Status: DC
  Filled 2021-04-19: qty 30, 83d supply, fill #0
  Filled 2021-05-31 – 2021-07-03 (×2): qty 30, 83d supply, fill #1
  Filled 2021-10-30: qty 30, 83d supply, fill #2
  Filled 2022-01-29: qty 30, 83d supply, fill #3
  Filled 2022-04-16: qty 30, 83d supply, fill #4

## 2021-04-19 MED ORDER — OZEMPIC (2 MG/DOSE) 8 MG/3ML ~~LOC~~ SOPN
PEN_INJECTOR | SUBCUTANEOUS | 3 refills | Status: DC
  Filled 2021-04-19: qty 3, 28d supply, fill #0
  Filled 2021-05-31: qty 3, 30d supply, fill #0
  Filled 2021-07-03: qty 3, 30d supply, fill #1
  Filled 2021-08-02: qty 3, 30d supply, fill #2

## 2021-04-25 ENCOUNTER — Other Ambulatory Visit (HOSPITAL_COMMUNITY): Payer: Self-pay

## 2021-04-25 DIAGNOSIS — R8289 Other abnormal findings on cytological and histological examination of urine: Secondary | ICD-10-CM | POA: Diagnosis not present

## 2021-04-25 DIAGNOSIS — N390 Urinary tract infection, site not specified: Secondary | ICD-10-CM | POA: Diagnosis not present

## 2021-04-25 DIAGNOSIS — B961 Klebsiella pneumoniae [K. pneumoniae] as the cause of diseases classified elsewhere: Secondary | ICD-10-CM | POA: Diagnosis not present

## 2021-04-25 DIAGNOSIS — R8290 Unspecified abnormal findings in urine: Secondary | ICD-10-CM | POA: Diagnosis not present

## 2021-04-25 DIAGNOSIS — Z87440 Personal history of urinary (tract) infections: Secondary | ICD-10-CM | POA: Diagnosis not present

## 2021-04-25 MED ORDER — SULFAMETHOXAZOLE-TRIMETHOPRIM 800-160 MG PO TABS
ORAL_TABLET | ORAL | 3 refills | Status: DC
  Filled 2021-04-25: qty 60, 30d supply, fill #0

## 2021-04-26 ENCOUNTER — Other Ambulatory Visit (HOSPITAL_COMMUNITY): Payer: Self-pay

## 2021-04-26 MED ORDER — SULFAMETHOXAZOLE-TRIMETHOPRIM 800-160 MG PO TABS
ORAL_TABLET | ORAL | 3 refills | Status: DC
  Filled 2021-04-26: qty 60, 90d supply, fill #0

## 2021-04-27 ENCOUNTER — Other Ambulatory Visit (HOSPITAL_COMMUNITY): Payer: Self-pay

## 2021-04-27 MED ORDER — CEFDINIR 300 MG PO CAPS
ORAL_CAPSULE | ORAL | 1 refills | Status: DC
  Filled 2021-04-27: qty 35, 17d supply, fill #0

## 2021-05-03 ENCOUNTER — Other Ambulatory Visit (HOSPITAL_COMMUNITY): Payer: Self-pay

## 2021-05-03 MED ORDER — ALPRAZOLAM 2 MG PO TABS
ORAL_TABLET | ORAL | 0 refills | Status: DC
  Filled 2021-05-03: qty 120, 30d supply, fill #0

## 2021-05-03 MED ORDER — ZOLPIDEM TARTRATE 10 MG PO TABS
ORAL_TABLET | ORAL | 0 refills | Status: DC
  Filled 2021-05-03: qty 30, 30d supply, fill #0

## 2021-05-03 MED ORDER — METFORMIN HCL 1000 MG PO TABS
ORAL_TABLET | ORAL | 1 refills | Status: DC
  Filled 2021-05-03: qty 180, 90d supply, fill #0

## 2021-05-09 ENCOUNTER — Other Ambulatory Visit (HOSPITAL_COMMUNITY): Payer: Self-pay

## 2021-05-10 DIAGNOSIS — Z7902 Long term (current) use of antithrombotics/antiplatelets: Secondary | ICD-10-CM | POA: Diagnosis not present

## 2021-05-10 DIAGNOSIS — E1129 Type 2 diabetes mellitus with other diabetic kidney complication: Secondary | ICD-10-CM | POA: Diagnosis not present

## 2021-05-10 DIAGNOSIS — I10 Essential (primary) hypertension: Secondary | ICD-10-CM | POA: Diagnosis not present

## 2021-05-10 DIAGNOSIS — Z5181 Encounter for therapeutic drug level monitoring: Secondary | ICD-10-CM | POA: Diagnosis not present

## 2021-05-10 DIAGNOSIS — Z794 Long term (current) use of insulin: Secondary | ICD-10-CM | POA: Diagnosis not present

## 2021-05-10 DIAGNOSIS — N182 Chronic kidney disease, stage 2 (mild): Secondary | ICD-10-CM | POA: Diagnosis not present

## 2021-05-10 DIAGNOSIS — E785 Hyperlipidemia, unspecified: Secondary | ICD-10-CM | POA: Diagnosis not present

## 2021-05-10 DIAGNOSIS — N390 Urinary tract infection, site not specified: Secondary | ICD-10-CM | POA: Diagnosis not present

## 2021-05-17 DIAGNOSIS — N3090 Cystitis, unspecified without hematuria: Secondary | ICD-10-CM | POA: Diagnosis not present

## 2021-05-17 DIAGNOSIS — K5730 Diverticulosis of large intestine without perforation or abscess without bleeding: Secondary | ICD-10-CM | POA: Diagnosis not present

## 2021-05-17 DIAGNOSIS — Z87440 Personal history of urinary (tract) infections: Secondary | ICD-10-CM | POA: Diagnosis not present

## 2021-05-17 DIAGNOSIS — N401 Enlarged prostate with lower urinary tract symptoms: Secondary | ICD-10-CM | POA: Diagnosis not present

## 2021-05-17 DIAGNOSIS — N3289 Other specified disorders of bladder: Secondary | ICD-10-CM | POA: Diagnosis not present

## 2021-05-17 DIAGNOSIS — K5790 Diverticulosis of intestine, part unspecified, without perforation or abscess without bleeding: Secondary | ICD-10-CM | POA: Diagnosis not present

## 2021-05-17 DIAGNOSIS — N390 Urinary tract infection, site not specified: Secondary | ICD-10-CM | POA: Diagnosis not present

## 2021-05-17 DIAGNOSIS — Z87442 Personal history of urinary calculi: Secondary | ICD-10-CM | POA: Diagnosis not present

## 2021-05-17 DIAGNOSIS — R8290 Unspecified abnormal findings in urine: Secondary | ICD-10-CM | POA: Diagnosis not present

## 2021-05-17 DIAGNOSIS — D1809 Hemangioma of other sites: Secondary | ICD-10-CM | POA: Diagnosis not present

## 2021-05-23 DIAGNOSIS — Z794 Long term (current) use of insulin: Secondary | ICD-10-CM | POA: Diagnosis not present

## 2021-05-23 DIAGNOSIS — E785 Hyperlipidemia, unspecified: Secondary | ICD-10-CM | POA: Diagnosis not present

## 2021-05-23 DIAGNOSIS — I10 Essential (primary) hypertension: Secondary | ICD-10-CM | POA: Diagnosis not present

## 2021-05-23 DIAGNOSIS — E1165 Type 2 diabetes mellitus with hyperglycemia: Secondary | ICD-10-CM | POA: Diagnosis not present

## 2021-05-23 DIAGNOSIS — E1129 Type 2 diabetes mellitus with other diabetic kidney complication: Secondary | ICD-10-CM | POA: Diagnosis not present

## 2021-05-24 DIAGNOSIS — Z79899 Other long term (current) drug therapy: Secondary | ICD-10-CM | POA: Diagnosis not present

## 2021-05-24 DIAGNOSIS — Z9861 Coronary angioplasty status: Secondary | ICD-10-CM | POA: Diagnosis not present

## 2021-05-24 DIAGNOSIS — N182 Chronic kidney disease, stage 2 (mild): Secondary | ICD-10-CM | POA: Diagnosis not present

## 2021-05-24 DIAGNOSIS — E1165 Type 2 diabetes mellitus with hyperglycemia: Secondary | ICD-10-CM | POA: Diagnosis not present

## 2021-05-24 DIAGNOSIS — G8929 Other chronic pain: Secondary | ICD-10-CM | POA: Diagnosis not present

## 2021-05-24 DIAGNOSIS — F419 Anxiety disorder, unspecified: Secondary | ICD-10-CM | POA: Diagnosis not present

## 2021-05-24 DIAGNOSIS — Z23 Encounter for immunization: Secondary | ICD-10-CM | POA: Diagnosis not present

## 2021-05-24 DIAGNOSIS — F5101 Primary insomnia: Secondary | ICD-10-CM | POA: Diagnosis not present

## 2021-05-24 DIAGNOSIS — I10 Essential (primary) hypertension: Secondary | ICD-10-CM | POA: Diagnosis not present

## 2021-05-24 DIAGNOSIS — E785 Hyperlipidemia, unspecified: Secondary | ICD-10-CM | POA: Diagnosis not present

## 2021-05-25 ENCOUNTER — Other Ambulatory Visit (HOSPITAL_COMMUNITY): Payer: Self-pay

## 2021-05-25 MED ORDER — OXYCODONE-ACETAMINOPHEN 10-325 MG PO TABS
ORAL_TABLET | ORAL | 0 refills | Status: AC
  Filled 2021-05-25: qty 120, 30d supply, fill #0

## 2021-05-29 ENCOUNTER — Other Ambulatory Visit (HOSPITAL_COMMUNITY): Payer: Self-pay

## 2021-05-31 ENCOUNTER — Other Ambulatory Visit (HOSPITAL_COMMUNITY): Payer: Self-pay

## 2021-06-01 ENCOUNTER — Other Ambulatory Visit (HOSPITAL_COMMUNITY): Payer: Self-pay

## 2021-06-01 MED ORDER — ZOLPIDEM TARTRATE 10 MG PO TABS
ORAL_TABLET | ORAL | 0 refills | Status: DC
  Filled 2021-06-01: qty 30, 30d supply, fill #0

## 2021-06-01 MED ORDER — ALPRAZOLAM 2 MG PO TABS
2.0000 mg | ORAL_TABLET | Freq: Four times a day (QID) | ORAL | 0 refills | Status: DC
  Filled 2021-06-01: qty 120, 30d supply, fill #0

## 2021-06-05 DIAGNOSIS — Z23 Encounter for immunization: Secondary | ICD-10-CM | POA: Diagnosis not present

## 2021-06-06 ENCOUNTER — Other Ambulatory Visit (HOSPITAL_COMMUNITY): Payer: Self-pay

## 2021-06-07 ENCOUNTER — Other Ambulatory Visit (HOSPITAL_COMMUNITY): Payer: Self-pay

## 2021-06-07 MED ORDER — LISINOPRIL 2.5 MG PO TABS
2.5000 mg | ORAL_TABLET | Freq: Every day | ORAL | 1 refills | Status: DC
  Filled 2021-06-07: qty 90, 90d supply, fill #0

## 2021-06-20 ENCOUNTER — Other Ambulatory Visit: Payer: Self-pay

## 2021-06-20 ENCOUNTER — Ambulatory Visit: Payer: Medicare Other

## 2021-06-20 DIAGNOSIS — I2510 Atherosclerotic heart disease of native coronary artery without angina pectoris: Secondary | ICD-10-CM | POA: Diagnosis not present

## 2021-06-28 ENCOUNTER — Ambulatory Visit: Payer: Medicare Other | Admitting: Cardiology

## 2021-07-03 ENCOUNTER — Other Ambulatory Visit (HOSPITAL_COMMUNITY): Payer: Self-pay

## 2021-07-05 ENCOUNTER — Ambulatory Visit: Payer: Medicare Other | Admitting: Student

## 2021-07-05 ENCOUNTER — Other Ambulatory Visit (HOSPITAL_COMMUNITY): Payer: Self-pay

## 2021-07-05 ENCOUNTER — Other Ambulatory Visit: Payer: Self-pay

## 2021-07-05 ENCOUNTER — Encounter: Payer: Self-pay | Admitting: Student

## 2021-07-05 VITALS — BP 134/75 | HR 82 | Temp 98.1°F | Resp 17 | Ht 72.0 in | Wt 288.4 lb

## 2021-07-05 DIAGNOSIS — I10 Essential (primary) hypertension: Secondary | ICD-10-CM

## 2021-07-05 DIAGNOSIS — I2510 Atherosclerotic heart disease of native coronary artery without angina pectoris: Secondary | ICD-10-CM | POA: Diagnosis not present

## 2021-07-05 MED ORDER — ALPRAZOLAM 2 MG PO TABS
2.0000 mg | ORAL_TABLET | Freq: Four times a day (QID) | ORAL | 1 refills | Status: DC
  Filled 2021-07-05: qty 120, 30d supply, fill #0
  Filled 2021-08-02: qty 120, 30d supply, fill #1

## 2021-07-05 MED ORDER — ZOLPIDEM TARTRATE 10 MG PO TABS
ORAL_TABLET | ORAL | 1 refills | Status: DC
  Filled 2021-07-05: qty 30, 30d supply, fill #0
  Filled 2021-08-02: qty 30, 30d supply, fill #1
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
## 2021-07-17 ENCOUNTER — Other Ambulatory Visit (HOSPITAL_COMMUNITY): Payer: Self-pay

## 2021-08-02 ENCOUNTER — Other Ambulatory Visit (HOSPITAL_COMMUNITY): Payer: Self-pay

## 2021-08-03 ENCOUNTER — Other Ambulatory Visit (HOSPITAL_COMMUNITY): Payer: Self-pay

## 2021-08-04 ENCOUNTER — Other Ambulatory Visit (HOSPITAL_COMMUNITY): Payer: Self-pay

## 2021-08-24 ENCOUNTER — Other Ambulatory Visit (HOSPITAL_COMMUNITY): Payer: Self-pay

## 2021-08-24 DIAGNOSIS — Z9861 Coronary angioplasty status: Secondary | ICD-10-CM | POA: Diagnosis not present

## 2021-08-24 DIAGNOSIS — I10 Essential (primary) hypertension: Secondary | ICD-10-CM | POA: Diagnosis not present

## 2021-08-24 DIAGNOSIS — G8929 Other chronic pain: Secondary | ICD-10-CM | POA: Diagnosis not present

## 2021-08-24 DIAGNOSIS — E1165 Type 2 diabetes mellitus with hyperglycemia: Secondary | ICD-10-CM | POA: Diagnosis not present

## 2021-08-24 DIAGNOSIS — F5101 Primary insomnia: Secondary | ICD-10-CM | POA: Diagnosis not present

## 2021-08-24 DIAGNOSIS — E785 Hyperlipidemia, unspecified: Secondary | ICD-10-CM | POA: Diagnosis not present

## 2021-08-24 DIAGNOSIS — N182 Chronic kidney disease, stage 2 (mild): Secondary | ICD-10-CM | POA: Diagnosis not present

## 2021-08-24 DIAGNOSIS — E1129 Type 2 diabetes mellitus with other diabetic kidney complication: Secondary | ICD-10-CM | POA: Diagnosis not present

## 2021-08-24 DIAGNOSIS — Z79899 Other long term (current) drug therapy: Secondary | ICD-10-CM | POA: Diagnosis not present

## 2021-08-24 MED ORDER — OZEMPIC (2 MG/DOSE) 8 MG/3ML ~~LOC~~ SOPN
PEN_INJECTOR | SUBCUTANEOUS | 2 refills | Status: DC
  Filled 2021-08-24: qty 9, 84d supply, fill #0

## 2021-08-25 ENCOUNTER — Other Ambulatory Visit (HOSPITAL_COMMUNITY): Payer: Self-pay

## 2021-08-30 ENCOUNTER — Other Ambulatory Visit (HOSPITAL_COMMUNITY): Payer: Self-pay

## 2021-08-30 DIAGNOSIS — F5101 Primary insomnia: Secondary | ICD-10-CM | POA: Diagnosis not present

## 2021-08-30 DIAGNOSIS — I10 Essential (primary) hypertension: Secondary | ICD-10-CM | POA: Diagnosis not present

## 2021-08-30 DIAGNOSIS — N390 Urinary tract infection, site not specified: Secondary | ICD-10-CM | POA: Diagnosis not present

## 2021-08-30 DIAGNOSIS — E785 Hyperlipidemia, unspecified: Secondary | ICD-10-CM | POA: Diagnosis not present

## 2021-08-30 DIAGNOSIS — F419 Anxiety disorder, unspecified: Secondary | ICD-10-CM | POA: Diagnosis not present

## 2021-08-30 DIAGNOSIS — R8289 Other abnormal findings on cytological and histological examination of urine: Secondary | ICD-10-CM | POA: Diagnosis not present

## 2021-08-30 DIAGNOSIS — E1165 Type 2 diabetes mellitus with hyperglycemia: Secondary | ICD-10-CM | POA: Diagnosis not present

## 2021-08-30 DIAGNOSIS — Z79899 Other long term (current) drug therapy: Secondary | ICD-10-CM | POA: Diagnosis not present

## 2021-08-30 DIAGNOSIS — Z9861 Coronary angioplasty status: Secondary | ICD-10-CM | POA: Diagnosis not present

## 2021-08-30 DIAGNOSIS — N182 Chronic kidney disease, stage 2 (mild): Secondary | ICD-10-CM | POA: Diagnosis not present

## 2021-08-30 DIAGNOSIS — G8929 Other chronic pain: Secondary | ICD-10-CM | POA: Diagnosis not present

## 2021-08-30 MED ORDER — LISINOPRIL 2.5 MG PO TABS
ORAL_TABLET | ORAL | 2 refills | Status: DC
  Filled 2021-08-30: qty 90, 90d supply, fill #0
  Filled 2021-12-11: qty 90, 90d supply, fill #1
  Filled 2022-03-01: qty 90, 90d supply, fill #2

## 2021-08-30 MED ORDER — OXYCODONE-ACETAMINOPHEN 10-325 MG PO TABS
ORAL_TABLET | ORAL | 0 refills | Status: DC
  Filled 2021-08-30: qty 120, 30d supply, fill #0

## 2021-08-30 MED ORDER — ZOLPIDEM TARTRATE 10 MG PO TABS
ORAL_TABLET | ORAL | 2 refills | Status: DC
  Filled 2021-08-30: qty 30, 30d supply, fill #0
  Filled 2021-09-28: qty 30, 30d supply, fill #1
  Filled 2021-10-30: qty 30, 30d supply, fill #2

## 2021-08-30 MED ORDER — ALPRAZOLAM 2 MG PO TABS
ORAL_TABLET | ORAL | 2 refills | Status: DC
  Filled 2021-08-30: qty 120, 30d supply, fill #0
  Filled 2021-09-28: qty 120, 30d supply, fill #1
  Filled 2021-10-30: qty 120, 30d supply, fill #2

## 2021-08-31 ENCOUNTER — Other Ambulatory Visit (HOSPITAL_COMMUNITY): Payer: Self-pay

## 2021-08-31 MED ORDER — CIPROFLOXACIN HCL 500 MG PO TABS
ORAL_TABLET | ORAL | 0 refills | Status: DC
  Filled 2021-08-31: qty 14, 7d supply, fill #0

## 2021-09-28 ENCOUNTER — Other Ambulatory Visit (HOSPITAL_COMMUNITY): Payer: Self-pay

## 2021-10-06 ENCOUNTER — Other Ambulatory Visit (HOSPITAL_COMMUNITY): Payer: Self-pay

## 2021-10-06 MED ORDER — PIOGLITAZONE HCL 45 MG PO TABS
ORAL_TABLET | ORAL | 0 refills | Status: DC
  Filled 2021-10-06: qty 15, 30d supply, fill #0

## 2021-10-19 ENCOUNTER — Other Ambulatory Visit (HOSPITAL_COMMUNITY): Payer: Self-pay

## 2021-10-30 ENCOUNTER — Other Ambulatory Visit (HOSPITAL_COMMUNITY): Payer: Self-pay

## 2021-10-30 MED ORDER — PIOGLITAZONE HCL 45 MG PO TABS
22.5000 mg | ORAL_TABLET | Freq: Every day | ORAL | 2 refills | Status: DC
  Filled 2021-10-30: qty 15, 30d supply, fill #0
  Filled 2021-12-11: qty 30, 60d supply, fill #1

## 2021-10-30 MED ORDER — METFORMIN HCL 1000 MG PO TABS
1000.0000 mg | ORAL_TABLET | Freq: Two times a day (BID) | ORAL | 2 refills | Status: DC
  Filled 2021-10-30: qty 180, 90d supply, fill #0
  Filled 2021-12-25 – 2022-01-29 (×2): qty 180, 90d supply, fill #1
  Filled 2022-04-25: qty 180, 90d supply, fill #2

## 2021-10-30 MED ORDER — ATORVASTATIN CALCIUM 40 MG PO TABS
40.0000 mg | ORAL_TABLET | Freq: Every day | ORAL | 2 refills | Status: DC
  Filled 2021-10-30: qty 90, 90d supply, fill #0
  Filled 2021-12-25 – 2022-01-29 (×2): qty 90, 90d supply, fill #1
  Filled 2022-04-25: qty 90, 90d supply, fill #2

## 2021-10-30 MED ORDER — SERTRALINE HCL 100 MG PO TABS
100.0000 mg | ORAL_TABLET | Freq: Every day | ORAL | 2 refills | Status: DC
  Filled 2021-10-30: qty 90, 90d supply, fill #0
  Filled 2022-01-29: qty 90, 90d supply, fill #1
  Filled 2022-04-16: qty 90, 90d supply, fill #2

## 2021-10-31 ENCOUNTER — Other Ambulatory Visit (HOSPITAL_COMMUNITY): Payer: Self-pay

## 2021-11-22 ENCOUNTER — Other Ambulatory Visit (HOSPITAL_COMMUNITY): Payer: Self-pay

## 2021-11-22 DIAGNOSIS — R350 Frequency of micturition: Secondary | ICD-10-CM | POA: Diagnosis not present

## 2021-11-22 DIAGNOSIS — E785 Hyperlipidemia, unspecified: Secondary | ICD-10-CM | POA: Diagnosis not present

## 2021-11-22 DIAGNOSIS — E1129 Type 2 diabetes mellitus with other diabetic kidney complication: Secondary | ICD-10-CM | POA: Diagnosis not present

## 2021-11-22 DIAGNOSIS — Z9861 Coronary angioplasty status: Secondary | ICD-10-CM | POA: Diagnosis not present

## 2021-11-22 DIAGNOSIS — I10 Essential (primary) hypertension: Secondary | ICD-10-CM | POA: Diagnosis not present

## 2021-11-22 DIAGNOSIS — N182 Chronic kidney disease, stage 2 (mild): Secondary | ICD-10-CM | POA: Diagnosis not present

## 2021-11-22 DIAGNOSIS — N390 Urinary tract infection, site not specified: Secondary | ICD-10-CM | POA: Diagnosis not present

## 2021-11-22 DIAGNOSIS — E1165 Type 2 diabetes mellitus with hyperglycemia: Secondary | ICD-10-CM | POA: Diagnosis not present

## 2021-11-22 MED ORDER — MOUNJARO 12.5 MG/0.5ML ~~LOC~~ SOAJ
SUBCUTANEOUS | 0 refills | Status: DC
Start: 2021-11-22 — End: 2023-10-11
  Filled 2021-12-11: qty 2, 28d supply, fill #0

## 2021-11-22 MED ORDER — MOUNJARO 10 MG/0.5ML ~~LOC~~ SOAJ
SUBCUTANEOUS | 0 refills | Status: DC
  Filled 2021-11-22: qty 2, 28d supply, fill #0

## 2021-11-22 MED ORDER — MOUNJARO 15 MG/0.5ML ~~LOC~~ SOAJ
SUBCUTANEOUS | 0 refills | Status: DC
  Filled 2021-12-25: qty 2, 28d supply, fill #0

## 2021-11-29 ENCOUNTER — Other Ambulatory Visit (HOSPITAL_COMMUNITY): Payer: Self-pay

## 2021-11-29 DIAGNOSIS — N182 Chronic kidney disease, stage 2 (mild): Secondary | ICD-10-CM | POA: Diagnosis not present

## 2021-11-29 DIAGNOSIS — F419 Anxiety disorder, unspecified: Secondary | ICD-10-CM | POA: Diagnosis not present

## 2021-11-29 DIAGNOSIS — Z79899 Other long term (current) drug therapy: Secondary | ICD-10-CM | POA: Diagnosis not present

## 2021-11-29 DIAGNOSIS — G8929 Other chronic pain: Secondary | ICD-10-CM | POA: Diagnosis not present

## 2021-11-29 DIAGNOSIS — N390 Urinary tract infection, site not specified: Secondary | ICD-10-CM | POA: Diagnosis not present

## 2021-11-29 DIAGNOSIS — Z9861 Coronary angioplasty status: Secondary | ICD-10-CM | POA: Diagnosis not present

## 2021-11-29 DIAGNOSIS — E1165 Type 2 diabetes mellitus with hyperglycemia: Secondary | ICD-10-CM | POA: Diagnosis not present

## 2021-11-29 DIAGNOSIS — I10 Essential (primary) hypertension: Secondary | ICD-10-CM | POA: Diagnosis not present

## 2021-11-29 DIAGNOSIS — E785 Hyperlipidemia, unspecified: Secondary | ICD-10-CM | POA: Diagnosis not present

## 2021-11-29 DIAGNOSIS — F5101 Primary insomnia: Secondary | ICD-10-CM | POA: Diagnosis not present

## 2021-11-29 MED ORDER — ALPRAZOLAM 2 MG PO TABS
2.0000 mg | ORAL_TABLET | Freq: Four times a day (QID) | ORAL | 2 refills | Status: DC
  Filled 2021-11-29: qty 101, 25d supply, fill #0
  Filled 2021-11-29: qty 19, 5d supply, fill #0
  Filled 2021-12-25: qty 120, 30d supply, fill #1
  Filled 2022-01-29: qty 120, 30d supply, fill #2

## 2021-11-29 MED ORDER — OXYCODONE-ACETAMINOPHEN 10-325 MG PO TABS
1.0000 | ORAL_TABLET | Freq: Four times a day (QID) | ORAL | 0 refills | Status: DC | PRN
  Filled 2021-11-29: qty 120, 30d supply, fill #0

## 2021-11-29 MED ORDER — CIPROFLOXACIN HCL 500 MG PO TABS
500.0000 mg | ORAL_TABLET | Freq: Two times a day (BID) | ORAL | 0 refills | Status: DC
  Filled 2021-11-29: qty 14, 7d supply, fill #0

## 2021-11-29 MED ORDER — ZOLPIDEM TARTRATE 10 MG PO TABS
10.0000 mg | ORAL_TABLET | Freq: Every evening | ORAL | 2 refills | Status: DC
  Filled 2021-11-29: qty 30, 30d supply, fill #0
  Filled 2021-12-25: qty 30, 30d supply, fill #1
  Filled 2022-01-29: qty 30, 30d supply, fill #2

## 2021-12-11 ENCOUNTER — Other Ambulatory Visit (HOSPITAL_COMMUNITY): Payer: Self-pay

## 2021-12-12 ENCOUNTER — Other Ambulatory Visit (HOSPITAL_COMMUNITY): Payer: Self-pay

## 2021-12-12 DIAGNOSIS — E119 Type 2 diabetes mellitus without complications: Secondary | ICD-10-CM | POA: Diagnosis not present

## 2021-12-12 DIAGNOSIS — H52223 Regular astigmatism, bilateral: Secondary | ICD-10-CM | POA: Diagnosis not present

## 2021-12-12 DIAGNOSIS — H524 Presbyopia: Secondary | ICD-10-CM | POA: Diagnosis not present

## 2021-12-25 ENCOUNTER — Other Ambulatory Visit (HOSPITAL_COMMUNITY): Payer: Self-pay

## 2021-12-26 ENCOUNTER — Other Ambulatory Visit (HOSPITAL_COMMUNITY): Payer: Self-pay

## 2021-12-27 ENCOUNTER — Other Ambulatory Visit (HOSPITAL_COMMUNITY): Payer: Self-pay

## 2021-12-28 ENCOUNTER — Other Ambulatory Visit (HOSPITAL_COMMUNITY): Payer: Self-pay

## 2021-12-29 ENCOUNTER — Other Ambulatory Visit (HOSPITAL_COMMUNITY): Payer: Self-pay

## 2022-01-01 ENCOUNTER — Other Ambulatory Visit (HOSPITAL_COMMUNITY): Payer: Self-pay

## 2022-01-29 ENCOUNTER — Other Ambulatory Visit (HOSPITAL_COMMUNITY): Payer: Self-pay

## 2022-01-29 MED ORDER — PIOGLITAZONE HCL 45 MG PO TABS
22.5000 mg | ORAL_TABLET | Freq: Every day | ORAL | 2 refills | Status: DC
  Filled 2022-01-29: qty 15, 30d supply, fill #0
  Filled 2022-03-01: qty 15, 30d supply, fill #1
  Filled 2022-04-16: qty 15, 30d supply, fill #2

## 2022-01-29 MED ORDER — MOUNJARO 15 MG/0.5ML ~~LOC~~ SOAJ
15.0000 mg | SUBCUTANEOUS | 3 refills | Status: DC
  Filled 2022-01-29: qty 2, 28d supply, fill #0
  Filled 2022-03-22: qty 2, 28d supply, fill #1
  Filled 2022-04-16: qty 2, 28d supply, fill #2
  Filled 2022-05-24: qty 2, 28d supply, fill #3

## 2022-01-30 ENCOUNTER — Other Ambulatory Visit (HOSPITAL_COMMUNITY): Payer: Self-pay

## 2022-01-31 ENCOUNTER — Other Ambulatory Visit (HOSPITAL_COMMUNITY): Payer: Self-pay

## 2022-02-01 ENCOUNTER — Other Ambulatory Visit (HOSPITAL_COMMUNITY): Payer: Self-pay

## 2022-02-02 ENCOUNTER — Other Ambulatory Visit (HOSPITAL_COMMUNITY): Payer: Self-pay

## 2022-02-05 DIAGNOSIS — Z23 Encounter for immunization: Secondary | ICD-10-CM | POA: Diagnosis not present

## 2022-02-27 ENCOUNTER — Other Ambulatory Visit (HOSPITAL_COMMUNITY): Payer: Self-pay

## 2022-02-27 DIAGNOSIS — R350 Frequency of micturition: Secondary | ICD-10-CM | POA: Diagnosis not present

## 2022-02-27 DIAGNOSIS — I10 Essential (primary) hypertension: Secondary | ICD-10-CM | POA: Diagnosis not present

## 2022-02-27 DIAGNOSIS — E1165 Type 2 diabetes mellitus with hyperglycemia: Secondary | ICD-10-CM | POA: Diagnosis not present

## 2022-02-27 DIAGNOSIS — E785 Hyperlipidemia, unspecified: Secondary | ICD-10-CM | POA: Diagnosis not present

## 2022-02-27 DIAGNOSIS — N182 Chronic kidney disease, stage 2 (mild): Secondary | ICD-10-CM | POA: Diagnosis not present

## 2022-02-27 DIAGNOSIS — F419 Anxiety disorder, unspecified: Secondary | ICD-10-CM | POA: Diagnosis not present

## 2022-02-27 DIAGNOSIS — N390 Urinary tract infection, site not specified: Secondary | ICD-10-CM | POA: Diagnosis not present

## 2022-02-27 DIAGNOSIS — Z79899 Other long term (current) drug therapy: Secondary | ICD-10-CM | POA: Diagnosis not present

## 2022-02-27 DIAGNOSIS — Z9861 Coronary angioplasty status: Secondary | ICD-10-CM | POA: Diagnosis not present

## 2022-02-27 DIAGNOSIS — E1129 Type 2 diabetes mellitus with other diabetic kidney complication: Secondary | ICD-10-CM | POA: Diagnosis not present

## 2022-02-27 MED ORDER — CEPHALEXIN 250 MG PO CAPS
250.0000 mg | ORAL_CAPSULE | Freq: Four times a day (QID) | ORAL | 0 refills | Status: DC | PRN
  Filled 2022-02-27: qty 28, 7d supply, fill #0

## 2022-03-01 ENCOUNTER — Other Ambulatory Visit (HOSPITAL_COMMUNITY): Payer: Self-pay

## 2022-03-01 MED ORDER — TECHLITE PEN NEEDLES 31G X 8 MM MISC
1 refills | Status: DC
  Filled 2022-03-01: qty 100, 50d supply, fill #0
  Filled 2022-05-30: qty 100, 50d supply, fill #1
  Filled 2022-09-10: qty 100, 50d supply, fill #2

## 2022-03-02 ENCOUNTER — Other Ambulatory Visit (HOSPITAL_COMMUNITY): Payer: Self-pay

## 2022-03-05 ENCOUNTER — Other Ambulatory Visit (HOSPITAL_COMMUNITY): Payer: Self-pay

## 2022-03-06 ENCOUNTER — Other Ambulatory Visit (HOSPITAL_COMMUNITY): Payer: Self-pay

## 2022-03-06 DIAGNOSIS — I10 Essential (primary) hypertension: Secondary | ICD-10-CM | POA: Diagnosis not present

## 2022-03-06 DIAGNOSIS — F5101 Primary insomnia: Secondary | ICD-10-CM | POA: Diagnosis not present

## 2022-03-06 DIAGNOSIS — R21 Rash and other nonspecific skin eruption: Secondary | ICD-10-CM | POA: Diagnosis not present

## 2022-03-06 DIAGNOSIS — G8929 Other chronic pain: Secondary | ICD-10-CM | POA: Diagnosis not present

## 2022-03-06 DIAGNOSIS — F418 Other specified anxiety disorders: Secondary | ICD-10-CM | POA: Diagnosis not present

## 2022-03-06 DIAGNOSIS — N182 Chronic kidney disease, stage 2 (mild): Secondary | ICD-10-CM | POA: Diagnosis not present

## 2022-03-06 DIAGNOSIS — E1129 Type 2 diabetes mellitus with other diabetic kidney complication: Secondary | ICD-10-CM | POA: Diagnosis not present

## 2022-03-06 DIAGNOSIS — E785 Hyperlipidemia, unspecified: Secondary | ICD-10-CM | POA: Diagnosis not present

## 2022-03-06 DIAGNOSIS — N390 Urinary tract infection, site not specified: Secondary | ICD-10-CM | POA: Diagnosis not present

## 2022-03-06 DIAGNOSIS — Z0000 Encounter for general adult medical examination without abnormal findings: Secondary | ICD-10-CM | POA: Diagnosis not present

## 2022-03-06 DIAGNOSIS — Z794 Long term (current) use of insulin: Secondary | ICD-10-CM | POA: Diagnosis not present

## 2022-03-06 MED ORDER — ALPRAZOLAM 2 MG PO TABS
2.0000 mg | ORAL_TABLET | Freq: Four times a day (QID) | ORAL | 2 refills | Status: AC
  Filled 2022-03-06: qty 120, 30d supply, fill #0
  Filled 2022-04-04: qty 120, 30d supply, fill #1
  Filled 2022-05-02: qty 120, 30d supply, fill #2

## 2022-03-06 MED ORDER — ZOLPIDEM TARTRATE 10 MG PO TABS
10.0000 mg | ORAL_TABLET | Freq: Every day | ORAL | 2 refills | Status: DC
  Filled 2022-03-06: qty 30, 30d supply, fill #0
  Filled 2022-04-04: qty 30, 30d supply, fill #1
  Filled 2022-05-02: qty 30, 30d supply, fill #2

## 2022-03-07 ENCOUNTER — Other Ambulatory Visit (HOSPITAL_COMMUNITY): Payer: Self-pay

## 2022-03-22 ENCOUNTER — Other Ambulatory Visit (HOSPITAL_COMMUNITY): Payer: Self-pay

## 2022-03-30 ENCOUNTER — Other Ambulatory Visit (HOSPITAL_COMMUNITY): Payer: Self-pay

## 2022-03-30 DIAGNOSIS — N401 Enlarged prostate with lower urinary tract symptoms: Secondary | ICD-10-CM | POA: Diagnosis not present

## 2022-03-30 DIAGNOSIS — N3020 Other chronic cystitis without hematuria: Secondary | ICD-10-CM | POA: Diagnosis not present

## 2022-03-30 DIAGNOSIS — R8271 Bacteriuria: Secondary | ICD-10-CM | POA: Diagnosis not present

## 2022-03-30 DIAGNOSIS — R3912 Poor urinary stream: Secondary | ICD-10-CM | POA: Diagnosis not present

## 2022-03-30 DIAGNOSIS — R3915 Urgency of urination: Secondary | ICD-10-CM | POA: Diagnosis not present

## 2022-03-30 MED ORDER — TAMSULOSIN HCL 0.4 MG PO CAPS
0.4000 mg | ORAL_CAPSULE | Freq: Every evening | ORAL | 3 refills | Status: DC
  Filled 2022-03-30: qty 30, 30d supply, fill #0
  Filled 2022-04-25: qty 30, 30d supply, fill #1
  Filled 2022-06-22: qty 30, 30d supply, fill #2
  Filled 2022-07-19: qty 30, 30d supply, fill #3

## 2022-04-02 ENCOUNTER — Other Ambulatory Visit (HOSPITAL_COMMUNITY): Payer: Self-pay

## 2022-04-04 ENCOUNTER — Other Ambulatory Visit (HOSPITAL_COMMUNITY): Payer: Self-pay

## 2022-04-16 ENCOUNTER — Other Ambulatory Visit (HOSPITAL_COMMUNITY): Payer: Self-pay

## 2022-04-17 ENCOUNTER — Other Ambulatory Visit (HOSPITAL_COMMUNITY): Payer: Self-pay

## 2022-04-25 ENCOUNTER — Other Ambulatory Visit (HOSPITAL_COMMUNITY): Payer: Self-pay

## 2022-05-02 ENCOUNTER — Other Ambulatory Visit (HOSPITAL_COMMUNITY): Payer: Self-pay

## 2022-05-09 ENCOUNTER — Other Ambulatory Visit (HOSPITAL_COMMUNITY): Payer: Self-pay

## 2022-05-09 MED ORDER — ATORVASTATIN CALCIUM 40 MG PO TABS
40.0000 mg | ORAL_TABLET | Freq: Every day | ORAL | 2 refills | Status: DC
  Filled 2022-05-09 – 2022-07-19 (×2): qty 90, 90d supply, fill #0
  Filled 2022-11-06: qty 90, 90d supply, fill #1
  Filled 2023-01-29: qty 90, 90d supply, fill #2

## 2022-05-11 ENCOUNTER — Other Ambulatory Visit (HOSPITAL_COMMUNITY): Payer: Self-pay

## 2022-05-11 DIAGNOSIS — R3912 Poor urinary stream: Secondary | ICD-10-CM | POA: Diagnosis not present

## 2022-05-11 DIAGNOSIS — R3915 Urgency of urination: Secondary | ICD-10-CM | POA: Diagnosis not present

## 2022-05-11 DIAGNOSIS — N3020 Other chronic cystitis without hematuria: Secondary | ICD-10-CM | POA: Diagnosis not present

## 2022-05-11 DIAGNOSIS — R8271 Bacteriuria: Secondary | ICD-10-CM | POA: Diagnosis not present

## 2022-05-11 MED ORDER — NITROFURANTOIN MACROCRYSTAL 50 MG PO CAPS
50.0000 mg | ORAL_CAPSULE | Freq: Every day | ORAL | 0 refills | Status: AC
  Filled 2022-05-11: qty 90, 90d supply, fill #0

## 2022-05-11 MED ORDER — CIPROFLOXACIN HCL 500 MG PO TABS
500.0000 mg | ORAL_TABLET | Freq: Two times a day (BID) | ORAL | 0 refills | Status: DC
  Filled 2022-05-11: qty 14, 7d supply, fill #0

## 2022-05-15 ENCOUNTER — Other Ambulatory Visit (HOSPITAL_COMMUNITY): Payer: Self-pay

## 2022-05-23 DIAGNOSIS — N390 Urinary tract infection, site not specified: Secondary | ICD-10-CM | POA: Diagnosis not present

## 2022-05-23 DIAGNOSIS — Z9861 Coronary angioplasty status: Secondary | ICD-10-CM | POA: Diagnosis not present

## 2022-05-23 DIAGNOSIS — E785 Hyperlipidemia, unspecified: Secondary | ICD-10-CM | POA: Diagnosis not present

## 2022-05-23 DIAGNOSIS — G8929 Other chronic pain: Secondary | ICD-10-CM | POA: Diagnosis not present

## 2022-05-23 DIAGNOSIS — Z79899 Other long term (current) drug therapy: Secondary | ICD-10-CM | POA: Diagnosis not present

## 2022-05-23 DIAGNOSIS — I10 Essential (primary) hypertension: Secondary | ICD-10-CM | POA: Diagnosis not present

## 2022-05-23 DIAGNOSIS — N182 Chronic kidney disease, stage 2 (mild): Secondary | ICD-10-CM | POA: Diagnosis not present

## 2022-05-23 DIAGNOSIS — E1129 Type 2 diabetes mellitus with other diabetic kidney complication: Secondary | ICD-10-CM | POA: Diagnosis not present

## 2022-05-24 ENCOUNTER — Other Ambulatory Visit (HOSPITAL_COMMUNITY): Payer: Self-pay

## 2022-05-24 DIAGNOSIS — Z79899 Other long term (current) drug therapy: Secondary | ICD-10-CM | POA: Diagnosis not present

## 2022-05-30 ENCOUNTER — Other Ambulatory Visit: Payer: Self-pay

## 2022-05-30 ENCOUNTER — Encounter: Payer: Self-pay | Admitting: Cardiology

## 2022-05-30 ENCOUNTER — Other Ambulatory Visit (HOSPITAL_COMMUNITY): Payer: Self-pay

## 2022-05-30 DIAGNOSIS — Z9861 Coronary angioplasty status: Secondary | ICD-10-CM | POA: Diagnosis not present

## 2022-05-30 DIAGNOSIS — G8929 Other chronic pain: Secondary | ICD-10-CM | POA: Diagnosis not present

## 2022-05-30 DIAGNOSIS — N390 Urinary tract infection, site not specified: Secondary | ICD-10-CM | POA: Diagnosis not present

## 2022-05-30 DIAGNOSIS — E785 Hyperlipidemia, unspecified: Secondary | ICD-10-CM | POA: Diagnosis not present

## 2022-05-30 DIAGNOSIS — I10 Essential (primary) hypertension: Secondary | ICD-10-CM | POA: Diagnosis not present

## 2022-05-30 DIAGNOSIS — E1129 Type 2 diabetes mellitus with other diabetic kidney complication: Secondary | ICD-10-CM | POA: Diagnosis not present

## 2022-05-30 DIAGNOSIS — F5101 Primary insomnia: Secondary | ICD-10-CM | POA: Diagnosis not present

## 2022-05-30 DIAGNOSIS — N182 Chronic kidney disease, stage 2 (mild): Secondary | ICD-10-CM | POA: Diagnosis not present

## 2022-05-30 DIAGNOSIS — F418 Other specified anxiety disorders: Secondary | ICD-10-CM | POA: Diagnosis not present

## 2022-05-30 DIAGNOSIS — Z794 Long term (current) use of insulin: Secondary | ICD-10-CM | POA: Diagnosis not present

## 2022-05-30 MED ORDER — ALPRAZOLAM 2 MG PO TABS
2.0000 mg | ORAL_TABLET | Freq: Four times a day (QID) | ORAL | 2 refills | Status: DC | PRN
  Filled 2022-05-30: qty 120, 30d supply, fill #0
  Filled 2022-07-10: qty 120, 30d supply, fill #1
  Filled 2022-08-07: qty 120, 30d supply, fill #2

## 2022-05-30 MED ORDER — ZOLPIDEM TARTRATE 10 MG PO TABS
10.0000 mg | ORAL_TABLET | Freq: Every evening | ORAL | 1 refills | Status: DC | PRN
  Filled 2022-05-30: qty 90, 90d supply, fill #0
  Filled 2022-08-27: qty 90, 90d supply, fill #1

## 2022-05-30 NOTE — Progress Notes (Signed)
Labs 05/23/2022:  Total cholesterol 129, triglycerides 92, HDL 44, LDL 67.  BUN 22, creatinine 1.12, EGFR 65/75 mL, potassium 5.0, LFTs normal.  Hb 13.4/HCT 41.2, platelets 226, normal indicis.

## 2022-05-31 ENCOUNTER — Other Ambulatory Visit (HOSPITAL_COMMUNITY): Payer: Self-pay

## 2022-05-31 ENCOUNTER — Other Ambulatory Visit: Payer: Self-pay

## 2022-05-31 DIAGNOSIS — Z1211 Encounter for screening for malignant neoplasm of colon: Secondary | ICD-10-CM | POA: Diagnosis not present

## 2022-05-31 DIAGNOSIS — N321 Vesicointestinal fistula: Secondary | ICD-10-CM | POA: Diagnosis not present

## 2022-05-31 MED ORDER — OXYCODONE-ACETAMINOPHEN 10-325 MG PO TABS
1.0000 | ORAL_TABLET | Freq: Four times a day (QID) | ORAL | 0 refills | Status: DC | PRN
  Filled 2022-05-31 (×2): qty 120, 30d supply, fill #0

## 2022-06-02 ENCOUNTER — Other Ambulatory Visit (HOSPITAL_COMMUNITY): Payer: Self-pay

## 2022-06-20 DIAGNOSIS — N3020 Other chronic cystitis without hematuria: Secondary | ICD-10-CM | POA: Diagnosis not present

## 2022-06-20 DIAGNOSIS — R3915 Urgency of urination: Secondary | ICD-10-CM | POA: Diagnosis not present

## 2022-06-20 DIAGNOSIS — N401 Enlarged prostate with lower urinary tract symptoms: Secondary | ICD-10-CM | POA: Diagnosis not present

## 2022-06-20 DIAGNOSIS — R3912 Poor urinary stream: Secondary | ICD-10-CM | POA: Diagnosis not present

## 2022-06-22 ENCOUNTER — Other Ambulatory Visit (HOSPITAL_COMMUNITY): Payer: Self-pay

## 2022-06-25 ENCOUNTER — Other Ambulatory Visit: Payer: Self-pay

## 2022-06-26 ENCOUNTER — Other Ambulatory Visit (HOSPITAL_COMMUNITY): Payer: Self-pay

## 2022-06-26 MED ORDER — MOUNJARO 15 MG/0.5ML ~~LOC~~ SOAJ
15.0000 mg | SUBCUTANEOUS | 3 refills | Status: DC
  Filled 2022-06-26 (×2): qty 2, 28d supply, fill #0
  Filled 2022-08-07 – 2022-12-14 (×3): qty 2, 28d supply, fill #1
  Filled 2023-01-07: qty 2, 28d supply, fill #2
  Filled 2023-01-29: qty 2, 28d supply, fill #3

## 2022-06-26 MED ORDER — LISINOPRIL 2.5 MG PO TABS
2.5000 mg | ORAL_TABLET | Freq: Every day | ORAL | 2 refills | Status: DC
  Filled 2022-06-26: qty 90, 90d supply, fill #0

## 2022-06-27 ENCOUNTER — Other Ambulatory Visit (HOSPITAL_COMMUNITY): Payer: Self-pay

## 2022-06-28 ENCOUNTER — Other Ambulatory Visit (HOSPITAL_COMMUNITY): Payer: Self-pay

## 2022-06-28 MED ORDER — MOUNJARO 15 MG/0.5ML ~~LOC~~ SOAJ
15.0000 mg | SUBCUTANEOUS | 3 refills | Status: DC
  Filled 2022-06-28 – 2022-07-19 (×2): qty 2, 28d supply, fill #0
  Filled 2022-08-28: qty 2, 28d supply, fill #1
  Filled 2022-09-19: qty 2, 28d supply, fill #2
  Filled 2022-10-12 – 2022-11-06 (×2): qty 2, 28d supply, fill #3

## 2022-06-28 MED ORDER — LISINOPRIL 2.5 MG PO TABS
2.5000 mg | ORAL_TABLET | Freq: Every day | ORAL | 2 refills | Status: DC
  Filled 2022-06-28 – 2022-08-27 (×2): qty 90, 90d supply, fill #0
  Filled 2022-11-06 – 2022-11-22 (×2): qty 90, 90d supply, fill #1
  Filled 2023-02-18: qty 90, 90d supply, fill #2

## 2022-07-02 ENCOUNTER — Other Ambulatory Visit (HOSPITAL_COMMUNITY): Payer: Self-pay

## 2022-07-06 ENCOUNTER — Encounter: Payer: Self-pay | Admitting: Internal Medicine

## 2022-07-06 ENCOUNTER — Ambulatory Visit: Payer: Medicare Other | Admitting: Internal Medicine

## 2022-07-06 ENCOUNTER — Ambulatory Visit: Payer: Medicare Other | Admitting: Student

## 2022-07-06 ENCOUNTER — Other Ambulatory Visit (HOSPITAL_COMMUNITY): Payer: Self-pay

## 2022-07-06 VITALS — BP 139/66 | HR 85 | Ht 72.0 in | Wt 295.0 lb

## 2022-07-06 DIAGNOSIS — I10 Essential (primary) hypertension: Secondary | ICD-10-CM | POA: Diagnosis not present

## 2022-07-06 DIAGNOSIS — I2510 Atherosclerotic heart disease of native coronary artery without angina pectoris: Secondary | ICD-10-CM | POA: Diagnosis not present

## 2022-07-06 DIAGNOSIS — E7800 Pure hypercholesterolemia, unspecified: Secondary | ICD-10-CM
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
## 2022-07-10 ENCOUNTER — Other Ambulatory Visit (HOSPITAL_COMMUNITY): Payer: Self-pay

## 2022-07-10 ENCOUNTER — Other Ambulatory Visit: Payer: Self-pay

## 2022-07-19 ENCOUNTER — Other Ambulatory Visit (HOSPITAL_COMMUNITY): Payer: Self-pay

## 2022-07-19 ENCOUNTER — Other Ambulatory Visit: Payer: Self-pay

## 2022-07-19 MED ORDER — SERTRALINE HCL 100 MG PO TABS
100.0000 mg | ORAL_TABLET | Freq: Every day | ORAL | 2 refills | Status: DC
  Filled 2022-07-19: qty 90, 90d supply, fill #0
  Filled 2022-11-06: qty 90, 90d supply, fill #1
  Filled 2023-02-11: qty 90, 90d supply, fill #2

## 2022-07-19 MED ORDER — METFORMIN HCL 1000 MG PO TABS
1000.0000 mg | ORAL_TABLET | Freq: Two times a day (BID) | ORAL | 2 refills | Status: DC
  Filled 2022-07-19: qty 180, 90d supply, fill #0
  Filled 2022-10-15: qty 180, 90d supply, fill #1
  Filled 2023-02-20: qty 180, 90d supply, fill #2

## 2022-07-21 ENCOUNTER — Other Ambulatory Visit (HOSPITAL_COMMUNITY): Payer: Self-pay

## 2022-07-25 ENCOUNTER — Other Ambulatory Visit: Payer: Self-pay

## 2022-07-25 ENCOUNTER — Other Ambulatory Visit (HOSPITAL_COMMUNITY): Payer: Self-pay

## 2022-07-25 MED ORDER — TRESIBA FLEXTOUCH 200 UNIT/ML ~~LOC~~ SOPN
PEN_INJECTOR | SUBCUTANEOUS | 3 refills | Status: DC
  Filled 2022-07-25: qty 30, 83d supply, fill #0
  Filled 2022-10-13 (×2): qty 30, 83d supply, fill #1
  Filled 2023-01-29: qty 30, 83d supply, fill #2
  Filled 2023-05-27: qty 30, 83d supply, fill #3

## 2022-08-07 ENCOUNTER — Other Ambulatory Visit (HOSPITAL_COMMUNITY): Payer: Self-pay

## 2022-08-08 DIAGNOSIS — N3020 Other chronic cystitis without hematuria: Secondary | ICD-10-CM | POA: Diagnosis not present

## 2022-08-08 DIAGNOSIS — R3912 Poor urinary stream: Secondary | ICD-10-CM | POA: Diagnosis not present

## 2022-08-08 DIAGNOSIS — N401 Enlarged prostate with lower urinary tract symptoms: Secondary | ICD-10-CM | POA: Diagnosis not present

## 2022-08-08 DIAGNOSIS — R3915 Urgency of urination: Secondary | ICD-10-CM | POA: Diagnosis not present

## 2022-08-08 DIAGNOSIS — R8271 Bacteriuria: Secondary | ICD-10-CM | POA: Diagnosis not present

## 2022-08-09 ENCOUNTER — Other Ambulatory Visit (HOSPITAL_COMMUNITY): Payer: Self-pay

## 2022-08-09 ENCOUNTER — Other Ambulatory Visit: Payer: Self-pay

## 2022-08-09 MED ORDER — TAMSULOSIN HCL 0.4 MG PO CAPS
0.8000 mg | ORAL_CAPSULE | Freq: Every day | ORAL | 3 refills | Status: DC
  Filled 2022-08-09: qty 180, 90d supply, fill #0
  Filled 2022-11-06: qty 180, 90d supply, fill #1
  Filled 2023-01-29: qty 180, 90d supply, fill #2
  Filled 2023-04-30: qty 180, 90d supply, fill #3

## 2022-08-09 MED ORDER — NITROFURANTOIN MACROCRYSTAL 100 MG PO CAPS
100.0000 mg | ORAL_CAPSULE | Freq: Every day | ORAL | 3 refills | Status: DC
  Filled 2022-08-09: qty 90, 90d supply, fill #0
  Filled 2022-11-06: qty 90, 90d supply, fill #1
  Filled 2023-01-29: qty 90, 90d supply, fill #2
  Filled 2023-05-06: qty 90, 90d supply, fill #3

## 2022-08-21 ENCOUNTER — Other Ambulatory Visit (HOSPITAL_COMMUNITY): Payer: Self-pay

## 2022-08-22 ENCOUNTER — Other Ambulatory Visit: Payer: Self-pay

## 2022-08-22 DIAGNOSIS — F5101 Primary insomnia: Secondary | ICD-10-CM | POA: Diagnosis not present

## 2022-08-22 DIAGNOSIS — E1129 Type 2 diabetes mellitus with other diabetic kidney complication: Secondary | ICD-10-CM | POA: Diagnosis not present

## 2022-08-22 DIAGNOSIS — E785 Hyperlipidemia, unspecified: Secondary | ICD-10-CM | POA: Diagnosis not present

## 2022-08-22 DIAGNOSIS — I10 Essential (primary) hypertension: Secondary | ICD-10-CM | POA: Diagnosis not present

## 2022-08-22 DIAGNOSIS — Z79899 Other long term (current) drug therapy: Secondary | ICD-10-CM | POA: Diagnosis not present

## 2022-08-22 DIAGNOSIS — G8929 Other chronic pain: Secondary | ICD-10-CM | POA: Diagnosis not present

## 2022-08-22 DIAGNOSIS — Z9861 Coronary angioplasty status: Secondary | ICD-10-CM | POA: Diagnosis not present

## 2022-08-27 ENCOUNTER — Other Ambulatory Visit (HOSPITAL_COMMUNITY): Payer: Self-pay

## 2022-08-28 ENCOUNTER — Other Ambulatory Visit (HOSPITAL_COMMUNITY): Payer: Self-pay

## 2022-08-28 DIAGNOSIS — R3915 Urgency of urination: Secondary | ICD-10-CM | POA: Diagnosis not present

## 2022-08-29 ENCOUNTER — Other Ambulatory Visit: Payer: Self-pay

## 2022-09-01 ENCOUNTER — Other Ambulatory Visit (HOSPITAL_COMMUNITY): Payer: Self-pay

## 2022-09-03 DIAGNOSIS — Z125 Encounter for screening for malignant neoplasm of prostate: Secondary | ICD-10-CM | POA: Diagnosis not present

## 2022-09-03 DIAGNOSIS — G8929 Other chronic pain: Secondary | ICD-10-CM | POA: Diagnosis not present

## 2022-09-03 DIAGNOSIS — F5101 Primary insomnia: Secondary | ICD-10-CM | POA: Diagnosis not present

## 2022-09-03 DIAGNOSIS — I10 Essential (primary) hypertension: Secondary | ICD-10-CM | POA: Diagnosis not present

## 2022-09-03 DIAGNOSIS — N390 Urinary tract infection, site not specified: Secondary | ICD-10-CM | POA: Diagnosis not present

## 2022-09-03 DIAGNOSIS — E785 Hyperlipidemia, unspecified: Secondary | ICD-10-CM | POA: Diagnosis not present

## 2022-09-03 DIAGNOSIS — E1129 Type 2 diabetes mellitus with other diabetic kidney complication: Secondary | ICD-10-CM | POA: Diagnosis not present

## 2022-09-03 DIAGNOSIS — Z794 Long term (current) use of insulin: Secondary | ICD-10-CM | POA: Diagnosis not present

## 2022-09-03 DIAGNOSIS — N182 Chronic kidney disease, stage 2 (mild): Secondary | ICD-10-CM | POA: Diagnosis not present

## 2022-09-03 DIAGNOSIS — F418 Other specified anxiety disorders: Secondary | ICD-10-CM | POA: Diagnosis not present

## 2022-09-03 DIAGNOSIS — Z9861 Coronary angioplasty status: Secondary | ICD-10-CM | POA: Diagnosis not present

## 2022-09-04 ENCOUNTER — Other Ambulatory Visit (HOSPITAL_COMMUNITY): Payer: Self-pay

## 2022-09-04 MED ORDER — OXYCODONE-ACETAMINOPHEN 10-325 MG PO TABS
1.0000 | ORAL_TABLET | Freq: Four times a day (QID) | ORAL | 0 refills | Status: DC | PRN
  Filled 2022-09-04: qty 120, 30d supply, fill #0

## 2022-09-06 ENCOUNTER — Other Ambulatory Visit: Payer: Self-pay

## 2022-09-07 ENCOUNTER — Other Ambulatory Visit: Payer: Self-pay

## 2022-09-07 ENCOUNTER — Other Ambulatory Visit (HOSPITAL_COMMUNITY): Payer: Self-pay

## 2022-09-10 ENCOUNTER — Other Ambulatory Visit (HOSPITAL_COMMUNITY): Payer: Self-pay

## 2022-09-14 DIAGNOSIS — R3912 Poor urinary stream: Secondary | ICD-10-CM | POA: Diagnosis not present

## 2022-09-14 DIAGNOSIS — N401 Enlarged prostate with lower urinary tract symptoms: Secondary | ICD-10-CM | POA: Diagnosis not present

## 2022-09-14 DIAGNOSIS — R3915 Urgency of urination: Secondary | ICD-10-CM | POA: Diagnosis not present

## 2022-09-14 DIAGNOSIS — N3020 Other chronic cystitis without hematuria: Secondary | ICD-10-CM | POA: Diagnosis not present

## 2022-09-19 ENCOUNTER — Other Ambulatory Visit: Payer: Self-pay

## 2022-09-19 ENCOUNTER — Other Ambulatory Visit (HOSPITAL_COMMUNITY): Payer: Self-pay

## 2022-10-01 ENCOUNTER — Other Ambulatory Visit (HOSPITAL_COMMUNITY): Payer: Self-pay

## 2022-10-03 ENCOUNTER — Other Ambulatory Visit (HOSPITAL_COMMUNITY): Payer: Self-pay

## 2022-10-03 MED ORDER — ALPRAZOLAM 2 MG PO TABS
2.0000 mg | ORAL_TABLET | Freq: Four times a day (QID) | ORAL | 2 refills | Status: DC | PRN
  Filled 2022-10-03: qty 120, 30d supply, fill #0
  Filled 2022-11-06: qty 120, 30d supply, fill #1
  Filled 2022-12-24: qty 120, 30d supply, fill #2

## 2022-10-12 ENCOUNTER — Other Ambulatory Visit (HOSPITAL_COMMUNITY): Payer: Self-pay

## 2022-10-13 ENCOUNTER — Other Ambulatory Visit (HOSPITAL_COMMUNITY): Payer: Self-pay

## 2022-10-15 ENCOUNTER — Other Ambulatory Visit: Payer: Self-pay

## 2022-10-15 ENCOUNTER — Other Ambulatory Visit (HOSPITAL_COMMUNITY): Payer: Self-pay

## 2022-10-16 ENCOUNTER — Other Ambulatory Visit (HOSPITAL_COMMUNITY): Payer: Self-pay

## 2022-11-06 ENCOUNTER — Other Ambulatory Visit (HOSPITAL_COMMUNITY): Payer: Self-pay

## 2022-11-06 ENCOUNTER — Other Ambulatory Visit: Payer: Self-pay

## 2022-11-06 MED ORDER — ZOLPIDEM TARTRATE 10 MG PO TABS
10.0000 mg | ORAL_TABLET | Freq: Every day | ORAL | 1 refills | Status: DC
  Filled 2022-11-06 – 2022-12-03 (×2): qty 90, 90d supply, fill #0

## 2022-11-07 ENCOUNTER — Other Ambulatory Visit: Payer: Self-pay

## 2022-11-14 ENCOUNTER — Other Ambulatory Visit: Payer: Self-pay

## 2022-11-14 ENCOUNTER — Other Ambulatory Visit (HOSPITAL_COMMUNITY): Payer: Self-pay

## 2022-11-20 ENCOUNTER — Other Ambulatory Visit: Payer: Self-pay

## 2022-11-22 ENCOUNTER — Other Ambulatory Visit (HOSPITAL_COMMUNITY): Payer: Self-pay

## 2022-12-03 ENCOUNTER — Other Ambulatory Visit (HOSPITAL_COMMUNITY): Payer: Self-pay

## 2022-12-03 ENCOUNTER — Other Ambulatory Visit: Payer: Self-pay

## 2022-12-05 ENCOUNTER — Other Ambulatory Visit (HOSPITAL_COMMUNITY): Payer: Self-pay

## 2022-12-05 MED ORDER — OXYCODONE-ACETAMINOPHEN 10-325 MG PO TABS
1.0000 | ORAL_TABLET | Freq: Four times a day (QID) | ORAL | 0 refills | Status: DC | PRN
  Filled 2022-12-05: qty 120, 30d supply, fill #0

## 2022-12-14 ENCOUNTER — Other Ambulatory Visit (HOSPITAL_COMMUNITY): Payer: Self-pay

## 2022-12-17 ENCOUNTER — Other Ambulatory Visit: Payer: Self-pay

## 2022-12-18 DIAGNOSIS — H524 Presbyopia: Secondary | ICD-10-CM | POA: Diagnosis not present

## 2022-12-18 DIAGNOSIS — E113211 Type 2 diabetes mellitus with mild nonproliferative diabetic retinopathy with macular edema, right eye: Secondary | ICD-10-CM | POA: Diagnosis not present

## 2022-12-18 DIAGNOSIS — H52223 Regular astigmatism, bilateral: Secondary | ICD-10-CM | POA: Diagnosis not present

## 2022-12-24 ENCOUNTER — Other Ambulatory Visit (HOSPITAL_COMMUNITY): Payer: Self-pay

## 2022-12-24 ENCOUNTER — Other Ambulatory Visit: Payer: Self-pay

## 2022-12-26 ENCOUNTER — Other Ambulatory Visit (HOSPITAL_COMMUNITY): Payer: Self-pay

## 2022-12-26 MED ORDER — TECHLITE PEN NEEDLES 31G X 8 MM MISC
Freq: Two times a day (BID) | 1 refills | Status: DC
  Filled 2022-12-26: qty 100, 50d supply, fill #0
  Filled 2023-03-22: qty 100, 50d supply, fill #1
  Filled 2023-06-08: qty 100, 50d supply, fill #2
  Filled 2023-08-13 – 2023-10-22 (×2): qty 100, 50d supply, fill #3

## 2023-01-07 ENCOUNTER — Other Ambulatory Visit (HOSPITAL_COMMUNITY): Payer: Self-pay

## 2023-01-29 ENCOUNTER — Other Ambulatory Visit: Payer: Self-pay

## 2023-01-29 ENCOUNTER — Other Ambulatory Visit (HOSPITAL_COMMUNITY): Payer: Self-pay

## 2023-01-29 MED ORDER — OXYCODONE-ACETAMINOPHEN 10-325 MG PO TABS
1.0000 | ORAL_TABLET | Freq: Four times a day (QID) | ORAL | 0 refills | Status: DC | PRN
  Filled 2023-01-29: qty 120, 30d supply, fill #0

## 2023-01-29 MED ORDER — ALPRAZOLAM 2 MG PO TABS
2.0000 mg | ORAL_TABLET | Freq: Four times a day (QID) | ORAL | 2 refills | Status: DC | PRN
  Filled 2023-01-29: qty 120, 30d supply, fill #0
  Filled 2023-06-19: qty 120, 30d supply, fill #1
  Filled 2023-07-23: qty 120, 30d supply, fill #2

## 2023-01-30 DIAGNOSIS — Z794 Long term (current) use of insulin: Secondary | ICD-10-CM | POA: Diagnosis not present

## 2023-01-30 DIAGNOSIS — M19042 Primary osteoarthritis, left hand: Secondary | ICD-10-CM | POA: Diagnosis not present

## 2023-01-30 DIAGNOSIS — M720 Palmar fascial fibromatosis [Dupuytren]: Secondary | ICD-10-CM | POA: Diagnosis not present

## 2023-01-30 DIAGNOSIS — E119 Type 2 diabetes mellitus without complications: Secondary | ICD-10-CM | POA: Diagnosis not present

## 2023-02-04 ENCOUNTER — Telehealth: Payer: Self-pay | Admitting: *Deleted

## 2023-02-04 NOTE — Telephone Encounter (Signed)
Patient is returning call.  °

## 2023-02-04 NOTE — Telephone Encounter (Signed)
Pre-operative Risk Assessment    Patient Name: Corey Romero  DOB: 02-08-1951 MRN: [id number]      Request for Surgical Clearance    Procedure:   Left hand duuytrens contracture release  Date of Surgery:  Clearance TBD                                 Surgeon:  Dr. Keturah Shavers Surgeon's Group or Practice Name:  [facility] Phone number:  [phone] Fax number:  [phone]   Type of Clearance Requested:   - Medical  - Pharmacy:  Hold Aspirin and Clopidogrel (Plavix) Not Indicated   Type of Anesthesia:   Regional   Additional requests/questions:    Signed, Emmit Pomfret   02/04/2023, 7:23 AM

## 2023-02-04 NOTE — Telephone Encounter (Signed)
Name: Corey Romero  DOB: 1950-12-28  MRN: [id number]  Primary Cardiologist: Peter Swaziland, MD   Preoperative team, please contact this patient and set up a phone call appointment for further preoperative risk assessment. Please obtain consent and complete medication review. Thank you for your help.  I confirm that guidance regarding antiplatelet and oral anticoagulation therapy has been completed and, if necessary, noted below.  Per office protocol, he may hold Plavix for 5 days prior to procedure and should resume as soon as hemodynamically stable postoperatively.  Ideally aspirin should be continued without interruption, however if the bleeding risk is too great, aspirin may be held for 5-7 days prior to surgery. Please resume aspirin post operatively when it is felt to be safe from a bleeding standpoint.     Carlos Levering, NP 02/04/2023, 1:29 PM [facility]

## 2023-02-05 ENCOUNTER — Other Ambulatory Visit (HOSPITAL_COMMUNITY): Payer: Self-pay

## 2023-02-05 ENCOUNTER — Telehealth: Payer: Self-pay

## 2023-02-05 MED ORDER — SILVER SULFADIAZINE 1 % EX CREA
TOPICAL_CREAM | CUTANEOUS | 6 refills | Status: DC
  Filled 2023-02-05: qty 50, 90d supply, fill #0
  Filled 2023-05-06: qty 50, 90d supply, fill #1

## 2023-02-05 NOTE — Telephone Encounter (Signed)
Spoke with patient who is agreeable to do a tele visit on 10/4 at 2:40 pm. Med rec and consent have been done.

## 2023-02-05 NOTE — Telephone Encounter (Signed)
  Patient Consent for Virtual Visit        Corey Romero has provided verbal consent on 02/05/2023 for a virtual visit (video or telephone).   CONSENT FOR VIRTUAL VISIT FOR:  Corey Romero  By participating in this virtual visit I agree to the following:  I hereby voluntarily request, consent and authorize [facility] and its employed or contracted physicians, physician assistants, nurse practitioners or other licensed health care professionals (the Practitioner), to provide me with telemedicine health care services (the "Services") as deemed necessary by the treating Practitioner. I acknowledge and consent to receive the Services by the Practitioner via telemedicine. I understand that the telemedicine visit will involve communicating with the Practitioner through live audiovisual communication technology and the disclosure of certain medical information by electronic transmission. I acknowledge that I have been given the opportunity to request an in-person assessment or other available alternative prior to the telemedicine visit and am voluntarily participating in the telemedicine visit.  I understand that I have the right to withhold or withdraw my consent to the use of telemedicine in the course of my care at any time, without affecting my right to future care or treatment, and that the Practitioner or I may terminate the telemedicine visit at any time. I understand that I have the right to inspect all information obtained and/or recorded in the course of the telemedicine visit and may receive copies of available information for a reasonable fee.  I understand that some of the potential risks of receiving the Services via telemedicine include:  Delay or interruption in medical evaluation due to technological equipment failure or disruption; Information transmitted may not be sufficient (e.g. poor resolution of images) to allow for appropriate medical decision making by the  Practitioner; and/or  In rare instances, security protocols could fail, causing a breach of personal health information.  Furthermore, I acknowledge that it is my responsibility to provide information about my medical history, conditions and care that is complete and accurate to the best of my ability. I acknowledge that Practitioner's advice, recommendations, and/or decision may be based on factors not within their control, such as incomplete or inaccurate data provided by me or distortions of diagnostic images or specimens that may result from electronic transmissions. I understand that the practice of medicine is not an exact science and that Practitioner makes no warranties or guarantees regarding treatment outcomes. I acknowledge that a copy of this consent can be made available to me via my patient portal [facility] MyChart), or I can request a printed copy by calling the office of [facility].    I understand that my insurance will be billed for this visit.   I have read or had this consent read to me. I understand the contents of this consent, which adequately explains the benefits and risks of the Services being provided via telemedicine.  I have been provided ample opportunity to ask questions regarding this consent and the Services and have had my questions answered to my satisfaction. I give my informed consent for the services to be provided through the use of telemedicine in my medical care

## 2023-02-11 ENCOUNTER — Other Ambulatory Visit (HOSPITAL_COMMUNITY): Payer: Self-pay
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
## 2023-02-15 ENCOUNTER — Ambulatory Visit: Payer: Medicare Other | Attending: Cardiology

## 2023-02-15 DIAGNOSIS — Z01810 Encounter for preprocedural cardiovascular examination: Secondary | ICD-10-CM

## 2023-02-15 DIAGNOSIS — Z01818 Encounter for other preprocedural examination: Secondary | ICD-10-CM

## 2023-02-18 ENCOUNTER — Other Ambulatory Visit (HOSPITAL_COMMUNITY): Payer: Self-pay

## 2023-02-20 ENCOUNTER — Other Ambulatory Visit (HOSPITAL_COMMUNITY): Payer: Self-pay

## 2023-02-20 DIAGNOSIS — E113292 Type 2 diabetes mellitus with mild nonproliferative diabetic retinopathy without macular edema, left eye: Secondary | ICD-10-CM | POA: Diagnosis not present

## 2023-02-20 DIAGNOSIS — H04123 Dry eye syndrome of bilateral lacrimal glands: Secondary | ICD-10-CM | POA: Diagnosis not present

## 2023-02-20 DIAGNOSIS — H25812 Combined forms of age-related cataract, left eye: Secondary | ICD-10-CM | POA: Diagnosis not present

## 2023-02-20 DIAGNOSIS — E113211 Type 2 diabetes mellitus with mild nonproliferative diabetic retinopathy with macular edema, right eye: Secondary | ICD-10-CM | POA: Diagnosis not present

## 2023-02-20 DIAGNOSIS — Z794 Long term (current) use of insulin: Secondary | ICD-10-CM | POA: Diagnosis not present

## 2023-02-27 ENCOUNTER — Other Ambulatory Visit: Payer: Self-pay

## 2023-02-27 ENCOUNTER — Other Ambulatory Visit (HOSPITAL_COMMUNITY): Payer: Self-pay

## 2023-02-27 MED ORDER — MOUNJARO 15 MG/0.5ML ~~LOC~~ SOAJ
15.0000 mg | SUBCUTANEOUS | 5 refills | Status: DC
  Filled 2023-02-27: qty 2, 28d supply, fill #0
  Filled 2023-03-22: qty 2, 28d supply, fill #1
  Filled 2023-04-16: qty 2, 28d supply, fill #2
  Filled 2023-05-16: qty 2, 28d supply, fill #3
  Filled 2023-06-19: qty 2, 28d supply, fill #4
  Filled 2023-07-10: qty 2, 28d supply, fill #5

## 2023-03-05 DIAGNOSIS — F419 Anxiety disorder, unspecified: Secondary | ICD-10-CM | POA: Diagnosis not present

## 2023-03-05 DIAGNOSIS — E1129 Type 2 diabetes mellitus with other diabetic kidney complication: Secondary | ICD-10-CM | POA: Diagnosis not present

## 2023-03-05 DIAGNOSIS — F5101 Primary insomnia: Secondary | ICD-10-CM | POA: Diagnosis not present

## 2023-03-05 DIAGNOSIS — E785 Hyperlipidemia, unspecified: Secondary | ICD-10-CM | POA: Diagnosis not present

## 2023-03-05 DIAGNOSIS — I10 Essential (primary) hypertension: Secondary | ICD-10-CM | POA: Diagnosis not present

## 2023-03-11 DIAGNOSIS — Z0000 Encounter for general adult medical examination without abnormal findings: Secondary | ICD-10-CM | POA: Diagnosis not present

## 2023-03-11 DIAGNOSIS — F418 Other specified anxiety disorders: Secondary | ICD-10-CM | POA: Diagnosis not present

## 2023-03-11 DIAGNOSIS — Z23 Encounter for immunization: Secondary | ICD-10-CM | POA: Diagnosis not present

## 2023-03-11 DIAGNOSIS — F5101 Primary insomnia: Secondary | ICD-10-CM | POA: Diagnosis not present

## 2023-03-11 DIAGNOSIS — E1129 Type 2 diabetes mellitus with other diabetic kidney complication: Secondary | ICD-10-CM | POA: Diagnosis not present

## 2023-03-11 DIAGNOSIS — N182 Chronic kidney disease, stage 2 (mild): Secondary | ICD-10-CM | POA: Diagnosis not present

## 2023-03-11 DIAGNOSIS — E785 Hyperlipidemia, unspecified: Secondary | ICD-10-CM | POA: Diagnosis not present

## 2023-03-11 DIAGNOSIS — N390 Urinary tract infection, site not specified: Secondary | ICD-10-CM | POA: Diagnosis not present

## 2023-03-11 DIAGNOSIS — G8929 Other chronic pain: Secondary | ICD-10-CM | POA: Diagnosis not present

## 2023-03-11 DIAGNOSIS — I10 Essential (primary) hypertension: Secondary | ICD-10-CM | POA: Diagnosis not present

## 2023-03-11 DIAGNOSIS — Z794 Long term (current) use of insulin: Secondary | ICD-10-CM | POA: Diagnosis not present

## 2023-03-12 ENCOUNTER — Other Ambulatory Visit (HOSPITAL_COMMUNITY): Payer: Self-pay

## 2023-03-12 ENCOUNTER — Other Ambulatory Visit: Payer: Self-pay

## 2023-03-12 MED ORDER — OXYCODONE-ACETAMINOPHEN 10-325 MG PO TABS
1.0000 | ORAL_TABLET | Freq: Four times a day (QID) | ORAL | 0 refills | Status: DC | PRN
  Filled 2023-03-12: qty 120, 30d supply, fill #0

## 2023-03-12 MED ORDER — ALPRAZOLAM 2 MG PO TABS
2.0000 mg | ORAL_TABLET | Freq: Four times a day (QID) | ORAL | 2 refills | Status: DC | PRN
  Filled 2023-03-12: qty 120, 30d supply, fill #0
  Filled 2023-04-16: qty 120, 30d supply, fill #1
  Filled 2023-05-16: qty 120, 30d supply, fill #2

## 2023-03-12 MED ORDER — ZOLPIDEM TARTRATE 10 MG PO TABS
10.0000 mg | ORAL_TABLET | Freq: Every evening | ORAL | 1 refills | Status: AC
  Filled 2023-03-12: qty 90, 90d supply, fill #0
  Filled 2023-06-08: qty 90, 90d supply, fill #1

## 2023-03-13 ENCOUNTER — Other Ambulatory Visit (HOSPITAL_COMMUNITY): Payer: Self-pay

## 2023-03-22 ENCOUNTER — Other Ambulatory Visit (HOSPITAL_COMMUNITY): Payer: Self-pay

## 2023-03-22 ENCOUNTER — Other Ambulatory Visit: Payer: Self-pay

## 2023-03-27 DIAGNOSIS — N401 Enlarged prostate with lower urinary tract symptoms: Secondary | ICD-10-CM | POA: Diagnosis not present

## 2023-03-27 DIAGNOSIS — R3912 Poor urinary stream: Secondary | ICD-10-CM | POA: Diagnosis not present

## 2023-03-27 DIAGNOSIS — R8271 Bacteriuria: Secondary | ICD-10-CM | POA: Diagnosis not present

## 2023-04-02 DIAGNOSIS — G8918 Other acute postprocedural pain: Secondary | ICD-10-CM | POA: Diagnosis not present

## 2023-04-02 DIAGNOSIS — M720 Palmar fascial fibromatosis [Dupuytren]: Secondary | ICD-10-CM | POA: Diagnosis not present

## 2023-04-10 DIAGNOSIS — M720 Palmar fascial fibromatosis [Dupuytren]: Secondary | ICD-10-CM | POA: Diagnosis not present

## 2023-04-16 ENCOUNTER — Other Ambulatory Visit: Payer: Self-pay

## 2023-04-16 ENCOUNTER — Other Ambulatory Visit (HOSPITAL_COMMUNITY): Payer: Self-pay

## 2023-04-17 DIAGNOSIS — E113211 Type 2 diabetes mellitus with mild nonproliferative diabetic retinopathy with macular edema, right eye: Secondary | ICD-10-CM | POA: Diagnosis not present

## 2023-04-19 DIAGNOSIS — M720 Palmar fascial fibromatosis [Dupuytren]: Secondary | ICD-10-CM | POA: Diagnosis not present

## 2023-04-19 DIAGNOSIS — Z4789 Encounter for other orthopedic aftercare: Secondary | ICD-10-CM | POA: Diagnosis not present

## 2023-04-24 DIAGNOSIS — M25642 Stiffness of left hand, not elsewhere classified: Secondary | ICD-10-CM | POA: Diagnosis not present

## 2023-04-30 ENCOUNTER — Other Ambulatory Visit (HOSPITAL_COMMUNITY): Payer: Self-pay

## 2023-04-30 ENCOUNTER — Other Ambulatory Visit: Payer: Self-pay

## 2023-05-01 ENCOUNTER — Other Ambulatory Visit (HOSPITAL_COMMUNITY): Payer: Self-pay

## 2023-05-01 ENCOUNTER — Other Ambulatory Visit: Payer: Self-pay

## 2023-05-06 ENCOUNTER — Other Ambulatory Visit (HOSPITAL_COMMUNITY): Payer: Self-pay

## 2023-05-06 MED ORDER — ATORVASTATIN CALCIUM 40 MG PO TABS
40.0000 mg | ORAL_TABLET | Freq: Every day | ORAL | 2 refills | Status: DC
  Filled 2023-05-06: qty 90, 90d supply, fill #0
  Filled 2023-08-05: qty 90, 90d supply, fill #1
  Filled 2023-11-04: qty 90, 90d supply, fill #2

## 2023-05-10 ENCOUNTER — Other Ambulatory Visit: Payer: Self-pay

## 2023-05-10 ENCOUNTER — Other Ambulatory Visit (HOSPITAL_COMMUNITY): Payer: Self-pay

## 2023-05-10 MED ORDER — ZOLPIDEM TARTRATE 10 MG PO TABS: 10.0000 mg | ORAL_TABLET | Freq: Every day | ORAL | 1 refills | Status: DC

## 2023-05-10 MED ORDER — SERTRALINE HCL 100 MG PO TABS
100.0000 mg | ORAL_TABLET | Freq: Every day | ORAL | 2 refills | Status: AC
  Filled 2023-05-10: qty 90, 90d supply, fill #0
  Filled 2023-08-05: qty 90, 90d supply, fill #1
  Filled 2023-12-04: qty 90, 90d supply, fill #2

## 2023-05-16 ENCOUNTER — Other Ambulatory Visit (HOSPITAL_BASED_OUTPATIENT_CLINIC_OR_DEPARTMENT_OTHER): Payer: Self-pay

## 2023-05-16 ENCOUNTER — Other Ambulatory Visit: Payer: Self-pay

## 2023-05-16 ENCOUNTER — Other Ambulatory Visit (HOSPITAL_COMMUNITY): Payer: Self-pay

## 2023-05-16 MED ORDER — LISINOPRIL 2.5 MG PO TABS
2.5000 mg | ORAL_TABLET | Freq: Every day | ORAL | 2 refills | Status: DC
  Filled 2023-05-16: qty 90, 90d supply, fill #0
  Filled 2023-08-05: qty 90, 90d supply, fill #1

## 2023-05-21 ENCOUNTER — Other Ambulatory Visit (HOSPITAL_COMMUNITY): Payer: Self-pay

## 2023-05-23 ENCOUNTER — Other Ambulatory Visit: Payer: Self-pay

## 2023-05-24 DIAGNOSIS — Z4789 Encounter for other orthopedic aftercare: Secondary | ICD-10-CM | POA: Diagnosis not present

## 2023-05-24 DIAGNOSIS — M720 Palmar fascial fibromatosis [Dupuytren]: Secondary | ICD-10-CM | POA: Diagnosis not present

## 2023-05-27 ENCOUNTER — Other Ambulatory Visit (HOSPITAL_COMMUNITY): Payer: Self-pay

## 2023-05-29 ENCOUNTER — Other Ambulatory Visit: Payer: Self-pay

## 2023-05-29 ENCOUNTER — Other Ambulatory Visit (HOSPITAL_COMMUNITY): Payer: Self-pay

## 2023-05-29 MED ORDER — METFORMIN HCL 1000 MG PO TABS
1000.0000 mg | ORAL_TABLET | Freq: Two times a day (BID) | ORAL | 2 refills | Status: DC
  Filled 2023-05-29: qty 180, 90d supply, fill #0
  Filled 2023-08-27 (×2): qty 180, 90d supply, fill #1
  Filled 2023-10-22 – 2023-11-04 (×2): qty 180, 90d supply, fill #2

## 2023-06-04 ENCOUNTER — Other Ambulatory Visit (HOSPITAL_COMMUNITY): Payer: Self-pay

## 2023-06-08 ENCOUNTER — Other Ambulatory Visit (HOSPITAL_COMMUNITY): Payer: Self-pay

## 2023-06-10 ENCOUNTER — Other Ambulatory Visit: Payer: Self-pay

## 2023-06-12 DIAGNOSIS — Z4789 Encounter for other orthopedic aftercare: Secondary | ICD-10-CM | POA: Diagnosis not present

## 2023-06-12 DIAGNOSIS — M720 Palmar fascial fibromatosis [Dupuytren]: Secondary | ICD-10-CM | POA: Diagnosis not present

## 2023-06-13 DIAGNOSIS — E785 Hyperlipidemia, unspecified: Secondary | ICD-10-CM | POA: Diagnosis not present

## 2023-06-13 DIAGNOSIS — I10 Essential (primary) hypertension: Secondary | ICD-10-CM | POA: Diagnosis not present

## 2023-06-13 DIAGNOSIS — E1129 Type 2 diabetes mellitus with other diabetic kidney complication: Secondary | ICD-10-CM | POA: Diagnosis not present

## 2023-06-13 DIAGNOSIS — Z794 Long term (current) use of insulin: Secondary | ICD-10-CM | POA: Diagnosis not present

## 2023-06-19 ENCOUNTER — Other Ambulatory Visit (HOSPITAL_COMMUNITY): Payer: Self-pay

## 2023-06-19 ENCOUNTER — Other Ambulatory Visit: Payer: Self-pay

## 2023-06-26 ENCOUNTER — Other Ambulatory Visit (HOSPITAL_COMMUNITY): Payer: Self-pay

## 2023-06-26 DIAGNOSIS — R202 Paresthesia of skin: Secondary | ICD-10-CM | POA: Diagnosis not present

## 2023-06-26 DIAGNOSIS — M720 Palmar fascial fibromatosis [Dupuytren]: Secondary | ICD-10-CM | POA: Diagnosis not present

## 2023-06-26 DIAGNOSIS — Z4789 Encounter for other orthopedic aftercare: Secondary | ICD-10-CM | POA: Diagnosis not present

## 2023-06-26 DIAGNOSIS — R200 Anesthesia of skin: Secondary | ICD-10-CM | POA: Diagnosis not present

## 2023-06-26 MED ORDER — GABAPENTIN 300 MG PO CAPS
300.0000 mg | ORAL_CAPSULE | Freq: Every day | ORAL | 0 refills | Status: DC
  Filled 2023-06-26: qty 30, 30d supply, fill #0

## 2023-07-01 ENCOUNTER — Other Ambulatory Visit (HOSPITAL_COMMUNITY): Payer: Self-pay

## 2023-07-08 ENCOUNTER — Ambulatory Visit: Payer: Medicare Other | Admitting: Cardiology

## 2023-07-08 ENCOUNTER — Ambulatory Visit: Payer: Medicare Other | Admitting: Internal Medicine

## 2023-07-08 NOTE — Progress Notes (Deleted)
 Cardiology Office Note:  .   Date:  07/08/2023  ID:  Corey Romero, DOB [birth year], MRN [id number] PCP: Irena Reichmann, DO  [facility] Providers Cardiologist:  Peter Swaziland, MD { Click to update primary MD,subspecialty MD or APP then REFRESH:1}  History of Present Illness: .   Corey Romero is a 73 y.o. Caucasian male patient with past medical history significant for hypertension, hyperlipidemia, diabetes mellitus, asymptomatic PVCs, coronary disease with remote PTCA in 1990s and stenting to his LAD on 03/11/2018 for an abnormal nuclear stress test, has a CTO of the Cx-OM previously evaluated by Dr. Swaziland for possible CTO intervention, recommended medical therapy.   Discussed the use of AI scribe software for clinical note transcription with the patient, who gave verbal consent to proceed.  History of Present Illness            Labs   External Labs:  PCP Labs 03/06/2023:  Hb 13.2/HCT 40.5, platelets 216, normal indicis.  Serum glucose 92 mg, BUN 19, creatinine 1.03, EGFR 77 mL, potassium 5.2, LFTs normal.  Total cholesterol 05/15/2027, triglycerides 111, HDL 45, LDL 64.  TSH normal at 1.  910 A1c 6.7%.  Urinary albumin: Creatinine ratio 34.    ***ROS Physical Exam:   VS:  There were no vitals taken for this visit.   Wt Readings from Last 3 Encounters:  07/06/22 295 lb (133.8 kg)  07/05/21 288 lb 6.4 oz (130.8 kg)  03/30/21 (!) 301 lb 12.8 oz (136.9 kg)     ***Physical Exam Studies Reviewed: .    Right and left heart catheterization 03/11/18:   STENT ORSIRO 3.5X22    EKG:         07/06/2022: sinus rhythm with first degree AV block. LAD with LAFB and iRBBB. No change compared to prior.   Medications and allergies    No Known Allergies   Current Outpatient Medications:    alprazolam (XANAX) 2 MG tablet, Take 1 tablet (2 mg total) by mouth 4 (four) times daily., Disp: 120 tablet, Rfl: 2   alprazolam (XANAX) 2 MG tablet, Take 1 tablet (2 mg total)  by mouth 4 (four) times daily as needed., Disp: 120 tablet, Rfl: 2   alprazolam (XANAX) 2 MG tablet, Take 1 tablet (2 mg total) by mouth 4 (four) times daily as needed., Disp: 120 tablet, Rfl: 2   aspirin (ASPIRIN CHILDRENS) 81 MG chewable tablet, Chew 1 tablet (81 mg total) by mouth daily., Disp: 180 tablet, Rfl: 2   atorvastatin (LIPITOR) 40 MG tablet, Take 1 tablet (40 mg total) by mouth daily., Disp: 90 tablet, Rfl: 2   clopidogrel (PLAVIX) 75 MG tablet, Take 1 tablet (75 mg total) by mouth daily., Disp: 90 tablet, Rfl: 3   DOCOSAHEXAENOIC ACID-EPA PO, Take 1 tablet by mouth daily. 120-180 MG, Disp: , Rfl:    gabapentin (NEURONTIN) 300 MG capsule, Take 1 capsule (300 mg total) by mouth at bedtime., Disp: 30 capsule, Rfl: 0   insulin degludec (TRESIBA FLEXTOUCH) 200 UNIT/ML FlexTouch Pen, Inject 72 units into the skin once daily in the morning.  Replaces LEVEMIR., Disp: 120 mL, Rfl: 3   Insulin Pen Needle (TECHLITE PEN NEEDLES) 31G X 8 MM MISC, Use to inject Levemir as directed 2 (two) times daily., Disp: 200 each, Rfl: 1   Insulin Pen Needle 31G X 5 MM MISC, Use to inject insulin once daily, Disp: 100 each, Rfl: 0   LEVEMIR FLEXTOUCH 100 UNIT/ML Pen, Inject 60 Units into the skin  every evening., Disp: , Rfl: 6   lisinopril (ZESTRIL) 2.5 MG tablet, Take 1 tablet (2.5 mg total) by mouth daily., Disp: 90 tablet, Rfl: 2   loratadine (CLARITIN) 10 MG tablet, Take 1 tablet (10 mg total) by mouth daily., Disp: , Rfl:    metFORMIN (GLUCOPHAGE) 1000 MG tablet, Take 1 tablet (1,000 mg total) by mouth 2 (two) times daily with a meal., Disp: 180 tablet, Rfl: 2   nitrofurantoin (MACRODANTIN) 100 MG capsule, Take 1 capsule (100 mg total) by mouth daily., Disp: 90 capsule, Rfl: 3   nitrofurantoin (MACRODANTIN) 50 MG capsule, Take 1 capsule (50 mg total) by mouth daily., Disp: 90 capsule, Rfl: 0   Omega-3 Fatty Acids (FISH OIL) 1000 MG CAPS, Take by mouth., Disp: , Rfl: 0   oxyCODONE-acetaminophen (PERCOCET)  10-325 MG per tablet, Take 1-2 tablets by mouth every 4 (four) hours as needed for pain. (Patient taking differently: Take 1-2 tablets by mouth every 6 (six) hours as needed for pain.), Disp: 60 tablet, Rfl: 0   oxyCODONE-acetaminophen (PERCOCET) 10-325 MG tablet, Take 1 tablet by mouth every 6 hours as needed for 30 days, Disp: 120 tablet, Rfl: 0   oxyCODONE-acetaminophen (PERCOCET) 10-325 MG tablet, Take 1 tablet by mouth every 6 hours as needed, Disp: 120 tablet, Rfl: 0   oxyCODONE-acetaminophen (PERCOCET) 10-325 MG tablet, Take 1 tablet by mouth every 6 (six) hours as needed., Disp: 120 tablet, Rfl: 0   oxyCODONE-acetaminophen (PERCOCET) 10-325 MG tablet, Take 1 tablet by mouth every 6 (six) hours as needed., Disp: 120 tablet, Rfl: 0   oxyCODONE-acetaminophen (PERCOCET) 10-325 MG tablet, Take 1 tablet by mouth every 6 (six) hours as needed., Disp: 120 tablet, Rfl: 0   oxyCODONE-acetaminophen (PERCOCET) 10-325 MG tablet, Take 1 tablet by mouth every 6 (six) hours as needed., Disp: 120 tablet, Rfl: 0   Polyethyl Glycol-Propyl Glycol 0.4-0.3 % SOLN, Place 1 drop into both eyes 3 (three) times daily., Disp: , Rfl:    sertraline (ZOLOFT) 100 MG tablet, Take 1 tablet (100 mg total) by mouth daily., Disp: 90 tablet, Rfl: 2   silver sulfADIAZINE (SILVADENE) 1 % cream, Apply 1 application topically daily., Disp: , Rfl:    silver sulfADIAZINE (SILVADENE) 1 % cream, 1 application Externally Once a day, Disp: 50 g, Rfl: 6   tamsulosin (FLOMAX) 0.4 MG CAPS capsule, Take 2 capsules (0.8 mg total) by mouth at bedtime., Disp: 180 capsule, Rfl: 3   tirzepatide (MOUNJARO) 10 MG/0.5ML Pen, Inject 10 mg into the skin once a week, Disp: 2 mL, Rfl: 0   tirzepatide (MOUNJARO) 12.5 MG/0.5ML Pen, Inject 12.5 mg into the skin once a week (Patient not taking: Reported on 02/05/2023), Disp: 2 mL, Rfl: 0   tirzepatide (MOUNJARO) 15 MG/0.5ML Pen, Inject 15 mg into the skin every 7 (seven) days., Disp: 2 mL, Rfl: 3   tirzepatide  (MOUNJARO) 15 MG/0.5ML Pen, Inject 15 mg into the skin every 7 (seven) days., Disp: 2 mL, Rfl: 5   zolpidem (AMBIEN) 10 MG tablet, Take 1 tablet (10 mg total) by mouth at bedtime., Disp: 90 tablet, Rfl: 1   zolpidem (AMBIEN) 10 MG tablet, Take 1 tablet (10 mg total) by mouth at bedtime., Disp: 90 tablet, Rfl: 1   ASSESSMENT AND PLAN: .      ICD-10-CM   1. Coronary artery disease involving native coronary artery of native heart without angina pectoris  I25.10     2. Primary hypertension  I10     3. Pure hypercholesterolemia  E78.00     4. Class 3 severe obesity due to excess calories with serious comorbidity and body mass index (BMI) of 40.0 to 44.9 in adult (HCC)  V25.366    E66.01    Z68.41       1. Coronary artery disease involving native coronary artery of native heart without angina pectoris ***  2. Primary hypertension ***  3. Pure hypercholesterolemia ***  Assessment and Plan                Signed,  Yates Decamp, MD, [facility] 07/08/2023, 5:57 AM [facility] [address] [place], [place] [address] Phone: [phone]. Fax:  [phone]

## 2023-07-10 ENCOUNTER — Other Ambulatory Visit (HOSPITAL_COMMUNITY): Payer: Self-pay

## 2023-07-23 ENCOUNTER — Other Ambulatory Visit: Payer: Self-pay

## 2023-07-23 ENCOUNTER — Other Ambulatory Visit (HOSPITAL_COMMUNITY): Payer: Self-pay

## 2023-07-23 MED ORDER — GABAPENTIN 300 MG PO CAPS
300.0000 mg | ORAL_CAPSULE | Freq: Every day | ORAL | 0 refills | Status: DC
Start: 2023-07-23 — End: 2023-08-05
  Filled 2023-07-23: qty 30, 30d supply, fill #0

## 2023-07-24 ENCOUNTER — Other Ambulatory Visit: Payer: Self-pay

## 2023-07-24 DIAGNOSIS — M6281 Muscle weakness (generalized): Secondary | ICD-10-CM | POA: Diagnosis not present

## 2023-07-24 DIAGNOSIS — M720 Palmar fascial fibromatosis [Dupuytren]: Secondary | ICD-10-CM | POA: Diagnosis not present

## 2023-07-24 DIAGNOSIS — Z4789 Encounter for other orthopedic aftercare: Secondary | ICD-10-CM | POA: Diagnosis not present

## 2023-07-24 DIAGNOSIS — Z9889 Other specified postprocedural states: Secondary | ICD-10-CM | POA: Diagnosis not present

## 2023-07-29 DIAGNOSIS — Z9889 Other specified postprocedural states: Secondary | ICD-10-CM | POA: Diagnosis not present

## 2023-07-29 DIAGNOSIS — M720 Palmar fascial fibromatosis [Dupuytren]: Secondary | ICD-10-CM | POA: Diagnosis not present

## 2023-07-29 DIAGNOSIS — Z4789 Encounter for other orthopedic aftercare: Secondary | ICD-10-CM | POA: Diagnosis not present

## 2023-07-29 DIAGNOSIS — M6281 Muscle weakness (generalized): Secondary | ICD-10-CM | POA: Diagnosis not present

## 2023-07-30 DIAGNOSIS — Z4789 Encounter for other orthopedic aftercare: Secondary | ICD-10-CM | POA: Diagnosis not present

## 2023-07-30 DIAGNOSIS — Z9889 Other specified postprocedural states: Secondary | ICD-10-CM | POA: Diagnosis not present

## 2023-07-30 DIAGNOSIS — M720 Palmar fascial fibromatosis [Dupuytren]: Secondary | ICD-10-CM | POA: Diagnosis not present

## 2023-07-30 DIAGNOSIS — M6281 Muscle weakness (generalized): Secondary | ICD-10-CM | POA: Diagnosis not present

## 2023-08-01 DIAGNOSIS — G5602 Carpal tunnel syndrome, left upper limb: Secondary | ICD-10-CM | POA: Diagnosis not present

## 2023-08-01 DIAGNOSIS — M720 Palmar fascial fibromatosis [Dupuytren]: Secondary | ICD-10-CM | POA: Diagnosis not present

## 2023-08-01 DIAGNOSIS — E119 Type 2 diabetes mellitus without complications: Secondary | ICD-10-CM | POA: Diagnosis not present

## 2023-08-01 DIAGNOSIS — G5622 Lesion of ulnar nerve, left upper limb: Secondary | ICD-10-CM | POA: Diagnosis not present

## 2023-08-05 ENCOUNTER — Other Ambulatory Visit: Payer: Self-pay

## 2023-08-05 ENCOUNTER — Other Ambulatory Visit (HOSPITAL_COMMUNITY): Payer: Self-pay

## 2023-08-05 DIAGNOSIS — G5602 Carpal tunnel syndrome, left upper limb: Secondary | ICD-10-CM | POA: Diagnosis not present

## 2023-08-05 DIAGNOSIS — G5622 Lesion of ulnar nerve, left upper limb: Secondary | ICD-10-CM | POA: Diagnosis not present

## 2023-08-05 MED ORDER — MOUNJARO 15 MG/0.5ML ~~LOC~~ SOAJ
15.0000 mg | SUBCUTANEOUS | 5 refills | Status: DC
  Filled 2023-08-05: qty 2, 28d supply, fill #0
  Filled 2023-08-29: qty 2, 28d supply, fill #1
  Filled 2023-09-30: qty 2, 28d supply, fill #2

## 2023-08-05 MED ORDER — GABAPENTIN 300 MG PO CAPS
300.0000 mg | ORAL_CAPSULE | Freq: Every day | ORAL | 0 refills | Status: DC
  Filled 2023-08-05: qty 90, 90d supply, fill #0

## 2023-08-05 MED ORDER — ALPRAZOLAM 2 MG PO TABS: 2.0000 mg | ORAL_TABLET | Freq: Four times a day (QID) | ORAL | 2 refills | Status: DC | PRN

## 2023-08-06 ENCOUNTER — Other Ambulatory Visit: Payer: Self-pay

## 2023-08-08 ENCOUNTER — Other Ambulatory Visit (HOSPITAL_COMMUNITY): Payer: Self-pay

## 2023-08-13 ENCOUNTER — Other Ambulatory Visit: Payer: Self-pay

## 2023-08-13 ENCOUNTER — Other Ambulatory Visit (HOSPITAL_COMMUNITY): Payer: Self-pay

## 2023-08-13 MED ORDER — TAMSULOSIN HCL 0.4 MG PO CAPS
0.8000 mg | ORAL_CAPSULE | Freq: Every day | ORAL | 3 refills | Status: AC
  Filled 2023-08-13: qty 180, 90d supply, fill #0
  Filled 2023-11-09: qty 180, 90d supply, fill #1
  Filled 2024-02-05 – 2024-03-02 (×2): qty 180, 90d supply, fill #2
  Filled 2024-06-02: qty 180, 90d supply, fill #3

## 2023-08-17 ENCOUNTER — Other Ambulatory Visit (HOSPITAL_COMMUNITY): Payer: Self-pay

## 2023-08-19 ENCOUNTER — Other Ambulatory Visit (HOSPITAL_COMMUNITY): Payer: Self-pay

## 2023-08-19 ENCOUNTER — Other Ambulatory Visit: Payer: Self-pay

## 2023-08-19 MED ORDER — PREGABALIN 25 MG PO CAPS
25.0000 mg | ORAL_CAPSULE | Freq: Every day | ORAL | 0 refills | Status: DC
  Filled 2023-08-19: qty 10, 10d supply, fill #0

## 2023-08-27 ENCOUNTER — Other Ambulatory Visit (HOSPITAL_COMMUNITY): Payer: Self-pay

## 2023-08-27 DIAGNOSIS — I10 Essential (primary) hypertension: Secondary | ICD-10-CM | POA: Diagnosis not present

## 2023-08-27 DIAGNOSIS — E1165 Type 2 diabetes mellitus with hyperglycemia: Secondary | ICD-10-CM | POA: Diagnosis not present

## 2023-08-27 DIAGNOSIS — Z794 Long term (current) use of insulin: Secondary | ICD-10-CM | POA: Diagnosis not present

## 2023-08-27 DIAGNOSIS — Z9861 Coronary angioplasty status: Secondary | ICD-10-CM | POA: Diagnosis not present

## 2023-08-27 DIAGNOSIS — E1129 Type 2 diabetes mellitus with other diabetic kidney complication: Secondary | ICD-10-CM | POA: Diagnosis not present

## 2023-08-27 DIAGNOSIS — M792 Neuralgia and neuritis, unspecified: Secondary | ICD-10-CM | POA: Diagnosis not present

## 2023-08-27 DIAGNOSIS — E785 Hyperlipidemia, unspecified: Secondary | ICD-10-CM | POA: Diagnosis not present

## 2023-08-27 DIAGNOSIS — N182 Chronic kidney disease, stage 2 (mild): Secondary | ICD-10-CM | POA: Diagnosis not present

## 2023-08-27 MED ORDER — PREGABALIN 50 MG PO CAPS
ORAL_CAPSULE | ORAL | 2 refills | Status: AC
  Filled 2023-08-27: qty 90, 30d supply, fill #0
  Filled 2023-09-30: qty 90, 30d supply, fill #1

## 2023-08-28 ENCOUNTER — Other Ambulatory Visit: Payer: Self-pay

## 2023-08-29 ENCOUNTER — Other Ambulatory Visit (HOSPITAL_COMMUNITY): Payer: Self-pay

## 2023-08-30 DIAGNOSIS — G5602 Carpal tunnel syndrome, left upper limb: Secondary | ICD-10-CM | POA: Diagnosis not present

## 2023-09-03 DIAGNOSIS — G5602 Carpal tunnel syndrome, left upper limb: Secondary | ICD-10-CM | POA: Diagnosis not present

## 2023-09-10 DIAGNOSIS — Z9861 Coronary angioplasty status: Secondary | ICD-10-CM | POA: Diagnosis not present

## 2023-09-10 DIAGNOSIS — E785 Hyperlipidemia, unspecified: Secondary | ICD-10-CM | POA: Diagnosis not present

## 2023-09-10 DIAGNOSIS — E1129 Type 2 diabetes mellitus with other diabetic kidney complication: Secondary | ICD-10-CM | POA: Diagnosis not present

## 2023-09-10 DIAGNOSIS — F418 Other specified anxiety disorders: Secondary | ICD-10-CM | POA: Diagnosis not present

## 2023-09-10 DIAGNOSIS — F5101 Primary insomnia: Secondary | ICD-10-CM | POA: Diagnosis not present

## 2023-09-10 DIAGNOSIS — N182 Chronic kidney disease, stage 2 (mild): Secondary | ICD-10-CM | POA: Diagnosis not present

## 2023-09-10 DIAGNOSIS — R531 Weakness: Secondary | ICD-10-CM | POA: Diagnosis not present

## 2023-09-10 DIAGNOSIS — Z794 Long term (current) use of insulin: Secondary | ICD-10-CM | POA: Diagnosis not present

## 2023-09-10 DIAGNOSIS — G8929 Other chronic pain: Secondary | ICD-10-CM | POA: Diagnosis not present

## 2023-09-10 DIAGNOSIS — I10 Essential (primary) hypertension: Secondary | ICD-10-CM | POA: Diagnosis not present

## 2023-09-11 ENCOUNTER — Encounter (HOSPITAL_COMMUNITY): Payer: Self-pay

## 2023-09-11 ENCOUNTER — Other Ambulatory Visit (HOSPITAL_COMMUNITY): Payer: Self-pay

## 2023-09-11 ENCOUNTER — Other Ambulatory Visit: Payer: Self-pay

## 2023-09-11 MED ORDER — ALPRAZOLAM 2 MG PO TABS
2.0000 mg | ORAL_TABLET | Freq: Four times a day (QID) | ORAL | 2 refills | Status: AC
  Filled 2023-09-11 – 2023-09-16 (×2): qty 120, 30d supply, fill #0
  Filled 2023-10-28: qty 120, 30d supply, fill #1
  Filled 2023-11-27: qty 120, 30d supply, fill #2

## 2023-09-11 MED ORDER — OXYCODONE-ACETAMINOPHEN 10-325 MG PO TABS
1.0000 | ORAL_TABLET | Freq: Four times a day (QID) | ORAL | 0 refills | Status: DC | PRN
  Filled 2023-09-11 – 2023-09-16 (×2): qty 120, 30d supply, fill #0

## 2023-09-11 MED ORDER — ZOLPIDEM TARTRATE 10 MG PO TABS
10.0000 mg | ORAL_TABLET | Freq: Every day | ORAL | 1 refills | Status: DC
  Filled 2023-09-11 – 2023-09-16 (×2): qty 90, 90d supply, fill #0
  Filled 2023-12-20 (×2): qty 90, 90d supply, fill #1

## 2023-09-12 ENCOUNTER — Other Ambulatory Visit: Payer: Self-pay

## 2023-09-16 ENCOUNTER — Other Ambulatory Visit (HOSPITAL_COMMUNITY): Payer: Self-pay

## 2023-09-16 ENCOUNTER — Other Ambulatory Visit: Payer: Self-pay

## 2023-09-16 DIAGNOSIS — S025XXA Fracture of tooth (traumatic), initial encounter for closed fracture: Secondary | ICD-10-CM | POA: Diagnosis not present

## 2023-09-16 DIAGNOSIS — S0083XA Contusion of other part of head, initial encounter: Secondary | ICD-10-CM | POA: Diagnosis not present

## 2023-09-16 DIAGNOSIS — S0990XA Unspecified injury of head, initial encounter: Secondary | ICD-10-CM | POA: Diagnosis not present

## 2023-09-16 DIAGNOSIS — I440 Atrioventricular block, first degree: Secondary | ICD-10-CM | POA: Diagnosis not present

## 2023-09-16 DIAGNOSIS — I491 Atrial premature depolarization: Secondary | ICD-10-CM | POA: Diagnosis not present

## 2023-09-16 DIAGNOSIS — M720 Palmar fascial fibromatosis [Dupuytren]: Secondary | ICD-10-CM | POA: Diagnosis not present

## 2023-09-16 DIAGNOSIS — Z9889 Other specified postprocedural states: Secondary | ICD-10-CM | POA: Diagnosis not present

## 2023-09-16 DIAGNOSIS — S022XXA Fracture of nasal bones, initial encounter for closed fracture: Secondary | ICD-10-CM | POA: Diagnosis not present

## 2023-09-16 DIAGNOSIS — Z4789 Encounter for other orthopedic aftercare: Secondary | ICD-10-CM | POA: Diagnosis not present

## 2023-09-16 DIAGNOSIS — S01511A Laceration without foreign body of lip, initial encounter: Secondary | ICD-10-CM | POA: Diagnosis not present

## 2023-09-16 DIAGNOSIS — I951 Orthostatic hypotension: Secondary | ICD-10-CM | POA: Diagnosis not present

## 2023-09-16 DIAGNOSIS — R9082 White matter disease, unspecified: Secondary | ICD-10-CM | POA: Diagnosis not present

## 2023-09-16 DIAGNOSIS — S61011A Laceration without foreign body of right thumb without damage to nail, initial encounter: Secondary | ICD-10-CM | POA: Diagnosis not present

## 2023-09-16 DIAGNOSIS — R296 Repeated falls: Secondary | ICD-10-CM | POA: Diagnosis not present

## 2023-09-16 DIAGNOSIS — R55 Syncope and collapse: Secondary | ICD-10-CM | POA: Diagnosis not present

## 2023-09-16 DIAGNOSIS — S199XXA Unspecified injury of neck, initial encounter: Secondary | ICD-10-CM | POA: Diagnosis not present

## 2023-09-23 DIAGNOSIS — Z9889 Other specified postprocedural states: Secondary | ICD-10-CM | POA: Diagnosis not present

## 2023-09-23 DIAGNOSIS — M720 Palmar fascial fibromatosis [Dupuytren]: Secondary | ICD-10-CM | POA: Diagnosis not present

## 2023-09-23 DIAGNOSIS — M6281 Muscle weakness (generalized): Secondary | ICD-10-CM | POA: Diagnosis not present

## 2023-09-23 DIAGNOSIS — Z4789 Encounter for other orthopedic aftercare: Secondary | ICD-10-CM | POA: Diagnosis not present

## 2023-09-25 ENCOUNTER — Other Ambulatory Visit (HOSPITAL_COMMUNITY): Payer: Self-pay

## 2023-09-25 ENCOUNTER — Other Ambulatory Visit: Payer: Self-pay

## 2023-09-25 DIAGNOSIS — N3020 Other chronic cystitis without hematuria: Secondary | ICD-10-CM | POA: Diagnosis not present

## 2023-09-25 DIAGNOSIS — R8271 Bacteriuria: Secondary | ICD-10-CM | POA: Diagnosis not present

## 2023-09-25 DIAGNOSIS — R3912 Poor urinary stream: Secondary | ICD-10-CM | POA: Diagnosis not present

## 2023-09-25 DIAGNOSIS — R3915 Urgency of urination: Secondary | ICD-10-CM | POA: Diagnosis not present

## 2023-09-25 DIAGNOSIS — N401 Enlarged prostate with lower urinary tract symptoms: Secondary | ICD-10-CM | POA: Diagnosis not present

## 2023-09-25 MED ORDER — NITROFURANTOIN MACROCRYSTAL 50 MG PO CAPS
50.0000 mg | ORAL_CAPSULE | Freq: Every day | ORAL | 3 refills | Status: DC
Start: 2023-09-25 — End: 2023-10-11
  Filled 2023-09-25: qty 90, 90d supply, fill #0

## 2023-09-30 ENCOUNTER — Other Ambulatory Visit: Payer: Self-pay

## 2023-09-30 ENCOUNTER — Other Ambulatory Visit (HOSPITAL_COMMUNITY): Payer: Self-pay

## 2023-10-11 ENCOUNTER — Encounter: Payer: Self-pay | Admitting: Cardiology

## 2023-10-11 ENCOUNTER — Other Ambulatory Visit (HOSPITAL_COMMUNITY): Payer: Self-pay

## 2023-10-11 ENCOUNTER — Ambulatory Visit: Attending: Cardiology | Admitting: Cardiology

## 2023-10-11 VITALS — BP 110/66 | HR 96 | Ht 71.0 in | Wt 250.2 lb

## 2023-10-11 DIAGNOSIS — I10 Essential (primary) hypertension: Secondary | ICD-10-CM | POA: Diagnosis not present

## 2023-10-11 DIAGNOSIS — G903 Multi-system degeneration of the autonomic nervous system: Secondary | ICD-10-CM | POA: Insufficient documentation

## 2023-10-11 DIAGNOSIS — E7800 Pure hypercholesterolemia, unspecified: Secondary | ICD-10-CM | POA: Diagnosis not present

## 2023-10-11 DIAGNOSIS — I2510 Atherosclerotic heart disease of native coronary artery without angina pectoris: Secondary | ICD-10-CM | POA: Insufficient documentation

## 2023-10-11 MED ORDER — FLUDROCORTISONE ACETATE 0.1 MG PO TABS
0.1000 mg | ORAL_TABLET | Freq: Every evening | ORAL | 2 refills | Status: DC
  Filled 2023-10-11: qty 29, 29d supply, fill #0
  Filled 2023-10-11: qty 1, 1d supply, fill #0
  Filled 2023-11-09: qty 30, 30d supply, fill #1
  Filled 2023-12-04: qty 30, 30d supply, fill #2

## 2023-10-11 NOTE — Patient Instructions (Signed)
 Medication Instructions:  Start florinef 0.1 mg by mouth every evening.  If systolic blood pressure >130 change to every other day *If you need a refill on your cardiac medications before your next appointment, please call your pharmacy*  Lab Work: none If you have labs (blood work) drawn today and your tests are completely normal, you will receive your results only by: MyChart Message (if you have MyChart) OR A paper copy in the mail If you have any lab test that is abnormal or we need to change your treatment, we will call you to review the results.  Testing/Procedures:  none  Follow-Up: At [facility], you and your health needs are our priority.  As part of our continuing mission to provide you with exceptional heart care, our providers are all part of one team.  This team includes your primary Cardiologist (physician) and Advanced Practice Providers or APPs (Physician Assistants and Nurse Practitioners) who all work together to provide you with the care you need, when you need it.  Your next appointment:   6 month(s)  Provider:   Dr Berry Bristol    We recommend signing up for the patient portal called "MyChart".  Sign up information is provided on this After Visit Summary.  MyChart is used to connect with patients for Virtual Visits (Telemedicine).  Patients are able to view lab/test results, encounter notes, upcoming appointments, etc.  Non-urgent messages can be sent to your provider as well.   To learn more about what you can do with MyChart, go to [email or web address].   Other Instructions

## 2023-10-11 NOTE — Progress Notes (Signed)
 Cardiology Office Note:  .   Date:  10/11/2023  ID:  Corey Romero, DOB November 19, 1950, MRN [id number] PCP: Pete Brand, DO  [facility] Providers Cardiologist:  Peter Swaziland, MD   History of Present Illness: .   Corey Romero is a 73 y.o. .Caucasian male patient with past medical history significant for hypertension, hyperlipidemia, diabetes mellitus, asymptomatic PVCs, coronary disease with remote PTCA in 1990s and stenting to his LAD on 03/11/2018 for an abnormal nuclear stress test, has a CTO of the circumflex coronary artery previously evaluated by Dr. Swaziland for possible CTO intervention, recommended medical therapy.  He has had a normal echocardiogram on 06/20/2021 with EF 60 to 65%. His main complaint today is dizziness and near syncope.  Discussed the use of AI scribe software for clinical note transcription with the patient, who gave verbal consent to proceed.  History of Present Illness Corey Romero "Kurt Phi" is a 73 year old male with diabetes and orthostatic hypotension who presents with dizziness and recent falls.  He experiences dizziness and falls, with a significant incident one month ago resulting in a fall and injuries requiring stitches. His medications have been adjusted frequently since the fall. He experiences shortness of breath and occasional dizzy spells, attributing these to blood pressure changes.  His blood pressure has been described as 'very soft', and he has been taken off all blood pressure medications. He regularly checks his blood pressure and associates dizziness with postural changes. His blood sugar levels have been high, with a recent reading of 170 mg/dL, and his A6T is 0.1%. He is on Mounjaro , Lyrica , Zoloft , and Flomax . No chest pain is reported.  Labs   External Labs:  PCP care everywhere labs 08/27/2023:  Hb 13.3/HCT 40.4, platelets 230.  Serum glucose 121 mg, BUN 23, creatinine 1.28, EGFR 59 mL, potassium 4.5, LFTs normal.  Total  cholesterol 127, triglycerides 123, HDL 39, LDL 66.  A1c 6.4%.  ROS  Review of Systems  Cardiovascular:  Positive for dyspnea on exertion (chronic). Negative for chest pain and leg swelling.  Neurological:  Positive for dizziness and light-headedness.    Physical Exam:   VS:  BP 110/66   Pulse 96   Ht 5\' 11"  (1.803 m)   Wt 250 lb 3.2 oz (113.5 kg)   SpO2 96%   BMI 34.90 kg/m    Wt Readings from Last 3 Encounters:  10/11/23 250 lb 3.2 oz (113.5 kg)  07/06/22 295 lb (133.8 kg)  07/05/21 288 lb 6.4 oz (130.8 kg)      Physical Exam Neck:     Vascular: No carotid bruit or JVD.  Cardiovascular:     Rate and Rhythm: Normal rate and regular rhythm.     Pulses: Intact distal pulses.          Dorsalis pedis pulses are 1+ on the right side and 1+ on the left side.       Posterior tibial pulses are 1+ on the right side and 1+ on the left side.     Heart sounds: Normal heart sounds. No murmur heard.    No gallop.  Pulmonary:     Effort: Pulmonary effort is normal.     Breath sounds: Normal breath sounds.  Abdominal:     General: Bowel sounds are normal.     Palpations: Abdomen is soft.  Musculoskeletal:     Right lower leg: No edema.     Left lower leg: No edema.    Studies Reviewed: .  CARDIAC CATHETERIZATION 03/11/2018  STENT ORSIRO 3.5X22     EKG:       EKG 03/30/2021: Sinus rhythm with first-degree AV block at rate of 81 bpm, left axis deviation, left anterior fascicular block. Incomplete right bundle branch block. Poor R wave progression, cannot exclude anteroseptal infarct old.    ASSESSMENT AND PLAN: .      ICD-10-CM   1. Coronary artery disease involving native coronary artery of native heart without angina pectoris  I25.10     2. Pure hypercholesterolemia  E78.00     3. Primary hypertension  I10 EKG 12-Lead      Assessment and Plan Assessment & Plan Neurogenic orthostatic hypotension Orthostatic hypotension with a drop in blood pressure from 110  mmHg supine to 86 mmHg standing, indicating autonomic neuropathy. Symptoms include dizziness upon standing, likely exacerbated by pregabalin , sertraline , and tamsulosin . Related to diabetic autonomic neuropathy. Medication discontinuation should be discussed with primary care provider due to potential benefits for other conditions. - Prescribe fludrocortisone  to be taken at night. - Monitor blood pressure daily, especially when symptomatic. - Adjust fludrocortisone  to every other day if systolic blood pressure exceeds 130 mmHg. - Discuss medication adjustments with primary care provider. - Follow up with primary care provider to assess blood pressure changes with new medication.  Type 2 diabetes mellitus with peripheral neuropathy Peripheral neuropathy contributing to orthostatic hypotension. Suboptimal blood sugar control with recent levels around 170 mg/dL. A1c is 6.4%. Weight loss noted, likely due to Mounjaro  and improved glycemic control. - Continue Mounjaro  for blood sugar control and weight management. - Monitor blood sugar levels regularly.  Coronary artery disease No current symptoms. EKG shows no new changes. No recent chest pain reported.  Hyperlipidemia Cholesterol levels well-controlled with atorvastatin  40 mg daily. - Continue atorvastatin  40 mg daily.  Hypertension Resolved due to weight loss and diabetic autonomic neuropathy. No current antihypertensive medications prescribed.   Signed,  Knox Perl, MD, [facility] 10/11/2023, 11:08 AM [facility] [address] [place], [place] [address] Phone: [phone]. Fax:  [phone]

## 2023-10-15 DIAGNOSIS — F418 Other specified anxiety disorders: Secondary | ICD-10-CM | POA: Diagnosis not present

## 2023-10-15 DIAGNOSIS — I10 Essential (primary) hypertension: Secondary | ICD-10-CM | POA: Diagnosis not present

## 2023-10-15 DIAGNOSIS — Z9861 Coronary angioplasty status: Secondary | ICD-10-CM | POA: Diagnosis not present

## 2023-10-15 DIAGNOSIS — I2510 Atherosclerotic heart disease of native coronary artery without angina pectoris: Secondary | ICD-10-CM | POA: Diagnosis not present

## 2023-10-15 DIAGNOSIS — E785 Hyperlipidemia, unspecified: Secondary | ICD-10-CM | POA: Diagnosis not present

## 2023-10-15 DIAGNOSIS — E1129 Type 2 diabetes mellitus with other diabetic kidney complication: Secondary | ICD-10-CM | POA: Diagnosis not present

## 2023-10-15 DIAGNOSIS — G903 Multi-system degeneration of the autonomic nervous system: Secondary | ICD-10-CM | POA: Diagnosis not present

## 2023-10-15 DIAGNOSIS — Z794 Long term (current) use of insulin: Secondary | ICD-10-CM | POA: Diagnosis not present

## 2023-10-22 ENCOUNTER — Other Ambulatory Visit (HOSPITAL_COMMUNITY): Payer: Self-pay

## 2023-10-24 ENCOUNTER — Other Ambulatory Visit (HOSPITAL_COMMUNITY): Payer: Self-pay

## 2023-10-24 MED ORDER — MOUNJARO 15 MG/0.5ML ~~LOC~~ SOAJ
15.0000 mg | SUBCUTANEOUS | 5 refills | Status: DC
  Filled 2023-10-24: qty 2, 28d supply, fill #0
  Filled 2023-11-18: qty 2, 28d supply, fill #1
  Filled 2023-12-26: qty 2, 28d supply, fill #2
  Filled 2024-01-15 – 2024-01-16 (×2): qty 2, 28d supply, fill #3
  Filled 2024-02-15: qty 2, 28d supply, fill #4
  Filled 2024-03-14: qty 2, 28d supply, fill #5

## 2023-10-28 ENCOUNTER — Other Ambulatory Visit (HOSPITAL_COMMUNITY): Payer: Self-pay

## 2023-10-28 MED ORDER — ALPRAZOLAM 2 MG PO TABS
2.0000 mg | ORAL_TABLET | Freq: Four times a day (QID) | ORAL | 2 refills | Status: AC
  Filled 2023-12-26: qty 120, 30d supply, fill #0
  Filled 2024-01-27: qty 120, 30d supply, fill #1
  Filled 2024-02-24: qty 120, 30d supply, fill #2

## 2023-11-04 ENCOUNTER — Other Ambulatory Visit (HOSPITAL_COMMUNITY): Payer: Self-pay

## 2023-11-04 ENCOUNTER — Other Ambulatory Visit: Payer: Self-pay

## 2023-11-08 ENCOUNTER — Ambulatory Visit: Admitting: Cardiology

## 2023-11-09 ENCOUNTER — Other Ambulatory Visit (HOSPITAL_COMMUNITY): Payer: Self-pay

## 2023-11-11 ENCOUNTER — Other Ambulatory Visit (HOSPITAL_COMMUNITY): Payer: Self-pay

## 2023-11-13 ENCOUNTER — Other Ambulatory Visit (HOSPITAL_COMMUNITY): Payer: Self-pay

## 2023-11-18 ENCOUNTER — Other Ambulatory Visit (HOSPITAL_COMMUNITY): Payer: Self-pay

## 2023-11-27 ENCOUNTER — Other Ambulatory Visit: Payer: Self-pay

## 2023-11-27 ENCOUNTER — Other Ambulatory Visit (HOSPITAL_COMMUNITY): Payer: Self-pay

## 2023-12-03 ENCOUNTER — Other Ambulatory Visit (HOSPITAL_COMMUNITY): Payer: Self-pay

## 2023-12-03 ENCOUNTER — Other Ambulatory Visit: Payer: Self-pay

## 2023-12-03 MED ORDER — OXYCODONE-ACETAMINOPHEN 10-325 MG PO TABS
1.0000 | ORAL_TABLET | Freq: Four times a day (QID) | ORAL | 0 refills | Status: AC | PRN
  Filled 2023-12-03: qty 120, 30d supply, fill #0

## 2023-12-04 ENCOUNTER — Other Ambulatory Visit (HOSPITAL_COMMUNITY): Payer: Self-pay

## 2023-12-10 DIAGNOSIS — I2510 Atherosclerotic heart disease of native coronary artery without angina pectoris: Secondary | ICD-10-CM | POA: Diagnosis not present

## 2023-12-10 DIAGNOSIS — G903 Multi-system degeneration of the autonomic nervous system: Secondary | ICD-10-CM | POA: Diagnosis not present

## 2023-12-10 DIAGNOSIS — E1129 Type 2 diabetes mellitus with other diabetic kidney complication: Secondary | ICD-10-CM | POA: Diagnosis not present

## 2023-12-10 DIAGNOSIS — E785 Hyperlipidemia, unspecified: Secondary | ICD-10-CM | POA: Diagnosis not present

## 2023-12-10 DIAGNOSIS — I10 Essential (primary) hypertension: Secondary | ICD-10-CM | POA: Diagnosis not present

## 2023-12-10 DIAGNOSIS — F418 Other specified anxiety disorders: Secondary | ICD-10-CM | POA: Diagnosis not present

## 2023-12-16 ENCOUNTER — Other Ambulatory Visit: Payer: Self-pay

## 2023-12-16 ENCOUNTER — Other Ambulatory Visit (HOSPITAL_COMMUNITY): Payer: Self-pay

## 2023-12-16 MED ORDER — NITROFURANTOIN MACROCRYSTAL 50 MG PO CAPS
50.0000 mg | ORAL_CAPSULE | Freq: Every day | ORAL | 3 refills | Status: AC
  Filled 2023-12-16: qty 90, 90d supply, fill #0
  Filled 2024-03-11: qty 90, 90d supply, fill #1

## 2023-12-18 DIAGNOSIS — E113291 Type 2 diabetes mellitus with mild nonproliferative diabetic retinopathy without macular edema, right eye: Secondary | ICD-10-CM | POA: Diagnosis not present

## 2023-12-18 DIAGNOSIS — E113212 Type 2 diabetes mellitus with mild nonproliferative diabetic retinopathy with macular edema, left eye: Secondary | ICD-10-CM | POA: Diagnosis not present

## 2023-12-18 DIAGNOSIS — H52223 Regular astigmatism, bilateral: Secondary | ICD-10-CM | POA: Diagnosis not present

## 2023-12-18 DIAGNOSIS — H524 Presbyopia: Secondary | ICD-10-CM | POA: Diagnosis not present

## 2023-12-20 ENCOUNTER — Other Ambulatory Visit (HOSPITAL_COMMUNITY): Payer: Self-pay

## 2023-12-20 ENCOUNTER — Other Ambulatory Visit: Payer: Self-pay

## 2023-12-23 ENCOUNTER — Other Ambulatory Visit (HOSPITAL_COMMUNITY): Payer: Self-pay

## 2023-12-23 MED ORDER — ZOLPIDEM TARTRATE 10 MG PO TABS
10.0000 mg | ORAL_TABLET | Freq: Every evening | ORAL | 1 refills | Status: AC
  Filled 2024-03-17: qty 90, 90d supply, fill #0
  Filled 2024-06-13: qty 90, 90d supply, fill #1

## 2023-12-26 ENCOUNTER — Other Ambulatory Visit: Payer: Self-pay

## 2023-12-26 ENCOUNTER — Other Ambulatory Visit (HOSPITAL_COMMUNITY): Payer: Self-pay

## 2024-01-01 DIAGNOSIS — E113211 Type 2 diabetes mellitus with mild nonproliferative diabetic retinopathy with macular edema, right eye: Secondary | ICD-10-CM | POA: Diagnosis not present

## 2024-01-01 DIAGNOSIS — Z794 Long term (current) use of insulin: Secondary | ICD-10-CM | POA: Diagnosis not present

## 2024-01-01 DIAGNOSIS — E113292 Type 2 diabetes mellitus with mild nonproliferative diabetic retinopathy without macular edema, left eye: Secondary | ICD-10-CM | POA: Diagnosis not present

## 2024-01-15 ENCOUNTER — Other Ambulatory Visit (HOSPITAL_COMMUNITY): Payer: Self-pay

## 2024-01-15 ENCOUNTER — Other Ambulatory Visit: Payer: Self-pay | Admitting: Cardiology

## 2024-01-15 DIAGNOSIS — G903 Multi-system degeneration of the autonomic nervous system: Secondary | ICD-10-CM

## 2024-01-15 MED ORDER — FLUDROCORTISONE ACETATE 0.1 MG PO TABS
0.1000 mg | ORAL_TABLET | Freq: Every evening | ORAL | 0 refills | Status: DC
  Filled 2024-01-15: qty 30, 30d supply, fill #0

## 2024-01-27 ENCOUNTER — Other Ambulatory Visit (HOSPITAL_COMMUNITY): Payer: Self-pay

## 2024-01-27 MED ORDER — INSULIN PEN NEEDLE 31G X 8 MM MISC
1.0000 | Freq: Two times a day (BID) | 1 refills | Status: AC
  Filled 2024-01-27: qty 200, 90d supply, fill #0
  Filled 2024-04-27: qty 200, 90d supply, fill #1

## 2024-01-27 MED ORDER — INSULIN DEGLUDEC FLEXTOUCH 200 UNIT/ML ~~LOC~~ SOPN
24.0000 [IU] | PEN_INJECTOR | Freq: Every day | SUBCUTANEOUS | 1 refills | Status: AC
  Filled 2024-01-27: qty 12, 100d supply, fill #0
  Filled 2024-05-01: qty 12, 100d supply, fill #1

## 2024-01-28 ENCOUNTER — Other Ambulatory Visit: Payer: Self-pay

## 2024-02-05 ENCOUNTER — Other Ambulatory Visit (HOSPITAL_COMMUNITY): Payer: Self-pay

## 2024-02-10 ENCOUNTER — Other Ambulatory Visit (HOSPITAL_COMMUNITY): Payer: Self-pay

## 2024-02-11 ENCOUNTER — Other Ambulatory Visit (HOSPITAL_COMMUNITY): Payer: Self-pay

## 2024-02-11 ENCOUNTER — Other Ambulatory Visit: Payer: Self-pay

## 2024-02-11 MED ORDER — ATORVASTATIN CALCIUM 40 MG PO TABS
40.0000 mg | ORAL_TABLET | Freq: Every day | ORAL | 3 refills | Status: AC
  Filled 2024-02-11: qty 90, 90d supply, fill #0
  Filled 2024-05-01: qty 90, 90d supply, fill #1

## 2024-02-15 ENCOUNTER — Other Ambulatory Visit (HOSPITAL_COMMUNITY): Payer: Self-pay

## 2024-02-21 ENCOUNTER — Other Ambulatory Visit: Payer: Self-pay

## 2024-02-21 ENCOUNTER — Other Ambulatory Visit (HOSPITAL_COMMUNITY): Payer: Self-pay

## 2024-02-21 MED ORDER — METFORMIN HCL 1000 MG PO TABS
1000.0000 mg | ORAL_TABLET | Freq: Two times a day (BID) | ORAL | 1 refills | Status: AC
  Filled 2024-02-21: qty 180, 90d supply, fill #0
  Filled 2024-06-02: qty 180, 90d supply, fill #1

## 2024-02-24 ENCOUNTER — Other Ambulatory Visit: Payer: Self-pay

## 2024-03-02 ENCOUNTER — Other Ambulatory Visit (HOSPITAL_COMMUNITY): Payer: Self-pay

## 2024-03-11 ENCOUNTER — Other Ambulatory Visit (HOSPITAL_COMMUNITY): Payer: Self-pay

## 2024-03-12 ENCOUNTER — Other Ambulatory Visit (HOSPITAL_COMMUNITY): Payer: Self-pay

## 2024-03-12 DIAGNOSIS — F418 Other specified anxiety disorders: Secondary | ICD-10-CM | POA: Diagnosis not present

## 2024-03-12 DIAGNOSIS — E1165 Type 2 diabetes mellitus with hyperglycemia: Secondary | ICD-10-CM | POA: Diagnosis not present

## 2024-03-12 DIAGNOSIS — E785 Hyperlipidemia, unspecified: Secondary | ICD-10-CM | POA: Diagnosis not present

## 2024-03-12 DIAGNOSIS — N182 Chronic kidney disease, stage 2 (mild): Secondary | ICD-10-CM | POA: Diagnosis not present

## 2024-03-12 DIAGNOSIS — I2510 Atherosclerotic heart disease of native coronary artery without angina pectoris: Secondary | ICD-10-CM | POA: Diagnosis not present

## 2024-03-12 DIAGNOSIS — I10 Essential (primary) hypertension: Secondary | ICD-10-CM | POA: Diagnosis not present

## 2024-03-12 DIAGNOSIS — E1129 Type 2 diabetes mellitus with other diabetic kidney complication: Secondary | ICD-10-CM | POA: Diagnosis not present

## 2024-03-12 DIAGNOSIS — G903 Multi-system degeneration of the autonomic nervous system: Secondary | ICD-10-CM | POA: Diagnosis not present

## 2024-03-14 ENCOUNTER — Other Ambulatory Visit (HOSPITAL_COMMUNITY): Payer: Self-pay

## 2024-03-17 ENCOUNTER — Other Ambulatory Visit (HOSPITAL_COMMUNITY): Payer: Self-pay

## 2024-03-17 ENCOUNTER — Other Ambulatory Visit: Payer: Self-pay

## 2024-03-19 ENCOUNTER — Other Ambulatory Visit (HOSPITAL_COMMUNITY): Payer: Self-pay

## 2024-03-19 ENCOUNTER — Other Ambulatory Visit: Payer: Self-pay

## 2024-03-19 DIAGNOSIS — I10 Essential (primary) hypertension: Secondary | ICD-10-CM | POA: Diagnosis not present

## 2024-03-19 DIAGNOSIS — Z0000 Encounter for general adult medical examination without abnormal findings: Secondary | ICD-10-CM | POA: Diagnosis not present

## 2024-03-19 DIAGNOSIS — Z23 Encounter for immunization: Secondary | ICD-10-CM | POA: Diagnosis not present

## 2024-03-19 DIAGNOSIS — F5101 Primary insomnia: Secondary | ICD-10-CM | POA: Diagnosis not present

## 2024-03-19 DIAGNOSIS — I2510 Atherosclerotic heart disease of native coronary artery without angina pectoris: Secondary | ICD-10-CM | POA: Diagnosis not present

## 2024-03-19 DIAGNOSIS — Z794 Long term (current) use of insulin: Secondary | ICD-10-CM | POA: Diagnosis not present

## 2024-03-19 DIAGNOSIS — G903 Multi-system degeneration of the autonomic nervous system: Secondary | ICD-10-CM | POA: Diagnosis not present

## 2024-03-19 DIAGNOSIS — F419 Anxiety disorder, unspecified: Secondary | ICD-10-CM | POA: Diagnosis not present

## 2024-03-19 DIAGNOSIS — G8929 Other chronic pain: Secondary | ICD-10-CM | POA: Diagnosis not present

## 2024-03-19 DIAGNOSIS — E1129 Type 2 diabetes mellitus with other diabetic kidney complication: Secondary | ICD-10-CM | POA: Diagnosis not present

## 2024-03-19 DIAGNOSIS — N1831 Chronic kidney disease, stage 3a: Secondary | ICD-10-CM | POA: Diagnosis not present

## 2024-03-19 DIAGNOSIS — E782 Mixed hyperlipidemia: Secondary | ICD-10-CM | POA: Diagnosis not present

## 2024-03-19 MED ORDER — ALPRAZOLAM 2 MG PO TABS
2.0000 mg | ORAL_TABLET | Freq: Four times a day (QID) | ORAL | 1 refills | Status: DC | PRN
  Filled 2024-03-23: qty 120, 30d supply, fill #0
  Filled 2024-04-27: qty 120, 30d supply, fill #1

## 2024-03-19 MED ORDER — OXYCODONE-ACETAMINOPHEN 10-325 MG PO TABS
1.0000 | ORAL_TABLET | Freq: Four times a day (QID) | ORAL | 0 refills | Status: DC | PRN
  Filled 2024-03-19: qty 120, 30d supply, fill #0

## 2024-03-23 ENCOUNTER — Other Ambulatory Visit (HOSPITAL_COMMUNITY): Payer: Self-pay

## 2024-03-23 ENCOUNTER — Other Ambulatory Visit: Payer: Self-pay

## 2024-03-24 ENCOUNTER — Other Ambulatory Visit (HOSPITAL_COMMUNITY): Payer: Self-pay

## 2024-03-24 ENCOUNTER — Other Ambulatory Visit: Payer: Self-pay | Admitting: Cardiology

## 2024-03-24 ENCOUNTER — Other Ambulatory Visit: Payer: Self-pay

## 2024-03-24 DIAGNOSIS — R8271 Bacteriuria: Secondary | ICD-10-CM | POA: Diagnosis not present

## 2024-03-24 DIAGNOSIS — G903 Multi-system degeneration of the autonomic nervous system: Secondary | ICD-10-CM

## 2024-03-24 DIAGNOSIS — N3020 Other chronic cystitis without hematuria: Secondary | ICD-10-CM | POA: Diagnosis not present

## 2024-03-24 MED ORDER — NITROFURANTOIN MACROCRYSTAL 50 MG PO CAPS
50.0000 mg | ORAL_CAPSULE | Freq: Every day | ORAL | 3 refills | Status: AC
  Filled 2024-03-24: qty 90, 90d supply, fill #0

## 2024-03-24 MED ORDER — FLUDROCORTISONE ACETATE 0.1 MG PO TABS
0.1000 mg | ORAL_TABLET | Freq: Every evening | ORAL | 6 refills | Status: AC
  Filled 2024-03-24: qty 30, 30d supply, fill #0
  Filled 2024-05-01: qty 30, 30d supply, fill #1
  Filled 2024-06-02: qty 30, 30d supply, fill #2

## 2024-03-25 ENCOUNTER — Other Ambulatory Visit (HOSPITAL_COMMUNITY): Payer: Self-pay

## 2024-04-14 ENCOUNTER — Other Ambulatory Visit (HOSPITAL_COMMUNITY): Payer: Self-pay

## 2024-04-14 ENCOUNTER — Other Ambulatory Visit: Payer: Self-pay

## 2024-04-14 MED ORDER — MOUNJARO 15 MG/0.5ML ~~LOC~~ SOAJ
15.0000 mg | SUBCUTANEOUS | 5 refills | Status: AC
  Filled 2024-04-14: qty 2, 28d supply, fill #0
  Filled 2024-05-15 – 2024-05-19 (×2): qty 2, 28d supply, fill #1
  Filled 2024-06-11: qty 2, 28d supply, fill #2

## 2024-04-27 ENCOUNTER — Other Ambulatory Visit: Payer: Self-pay

## 2024-04-27 ENCOUNTER — Other Ambulatory Visit (HOSPITAL_COMMUNITY): Payer: Self-pay

## 2024-05-01 ENCOUNTER — Other Ambulatory Visit (HOSPITAL_COMMUNITY): Payer: Self-pay

## 2024-05-15 ENCOUNTER — Other Ambulatory Visit (HOSPITAL_COMMUNITY): Payer: Self-pay

## 2024-05-19 ENCOUNTER — Other Ambulatory Visit (HOSPITAL_COMMUNITY): Payer: Self-pay

## 2024-05-19 ENCOUNTER — Other Ambulatory Visit: Payer: Self-pay

## 2024-05-20 ENCOUNTER — Other Ambulatory Visit (HOSPITAL_COMMUNITY): Payer: Self-pay

## 2024-05-20 MED ORDER — OXYCODONE-ACETAMINOPHEN 10-325 MG PO TABS
1.0000 | ORAL_TABLET | Freq: Four times a day (QID) | ORAL | 0 refills | Status: AC | PRN
  Filled 2024-05-20: qty 120, 30d supply, fill #0

## 2024-06-02 ENCOUNTER — Other Ambulatory Visit (HOSPITAL_COMMUNITY): Payer: Self-pay

## 2024-06-11 ENCOUNTER — Other Ambulatory Visit (HOSPITAL_COMMUNITY): Payer: Self-pay

## 2024-06-11 ENCOUNTER — Ambulatory Visit: Admitting: Cardiology

## 2024-06-11 ENCOUNTER — Other Ambulatory Visit: Payer: Self-pay

## 2024-06-12 ENCOUNTER — Other Ambulatory Visit (HOSPITAL_COMMUNITY): Payer: Self-pay

## 2024-06-12 ENCOUNTER — Other Ambulatory Visit: Payer: Self-pay

## 2024-06-12 MED ORDER — ALPRAZOLAM 2 MG PO TABS
2.0000 mg | ORAL_TABLET | Freq: Four times a day (QID) | ORAL | 1 refills | Status: AC | PRN
  Filled 2024-06-12: qty 120, 30d supply, fill #0

## 2024-06-15 ENCOUNTER — Other Ambulatory Visit: Payer: Self-pay

## 2024-06-15 ENCOUNTER — Other Ambulatory Visit (HOSPITAL_COMMUNITY): Payer: Self-pay

## 2024-06-17 ENCOUNTER — Other Ambulatory Visit (HOSPITAL_COMMUNITY): Payer: Self-pay

## 2024-08-20 ENCOUNTER — Ambulatory Visit: Admitting: Cardiology
